# Patient Record
Sex: Female | Born: 1990 | Race: White | Hispanic: No | Smoking: Current every day smoker
Health system: Southern US, Community
[De-identification: ages and names within clinical notes are randomized; demographics above are authoritative.]

## PROBLEM LIST (undated history)

## (undated) DIAGNOSIS — G43909 Migraine, unspecified, not intractable, without status migrainosus: Secondary | ICD-10-CM

## (undated) DIAGNOSIS — F419 Anxiety disorder, unspecified: Secondary | ICD-10-CM

## (undated) DIAGNOSIS — F329 Major depressive disorder, single episode, unspecified: Secondary | ICD-10-CM

## (undated) DIAGNOSIS — N730 Acute parametritis and pelvic cellulitis: Secondary | ICD-10-CM

## (undated) DIAGNOSIS — F32A Depression, unspecified: Secondary | ICD-10-CM

## (undated) DIAGNOSIS — B009 Herpesviral infection, unspecified: Secondary | ICD-10-CM

---
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
## 2008-04-07 ENCOUNTER — Emergency Department: Payer: Self-pay | Admitting: Emergency Medicine

## 2008-08-25 ENCOUNTER — Emergency Department: Payer: Self-pay | Admitting: Emergency Medicine

## 2009-07-27 ENCOUNTER — Emergency Department: Payer: Self-pay | Admitting: Emergency Medicine

## 2010-01-18 ENCOUNTER — Emergency Department: Payer: Self-pay | Admitting: Emergency Medicine

## 2010-01-25 ENCOUNTER — Emergency Department: Payer: Self-pay | Admitting: Emergency Medicine

## 2010-03-27 ENCOUNTER — Emergency Department: Payer: Self-pay | Admitting: Unknown Physician Specialty

## 2011-05-01 ENCOUNTER — Emergency Department: Payer: Self-pay | Admitting: *Deleted

## 2011-10-12 IMAGING — US TRANSABDOMINAL ULTRASOUND OF PELVIS
1 series · 17 of 25 positions shown · non-contrast
Comparison: none

REASON FOR EXAM: pelvic pain for 2 months, ammenorrhea
COMMENTS:

[Series 1: transabdominal ultrasound of pelvis · 17 of 52 slices shown]
[im 1/52]
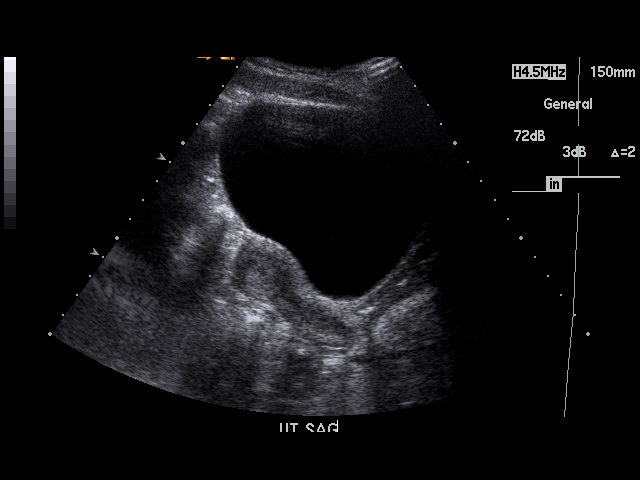
[im 5/52]
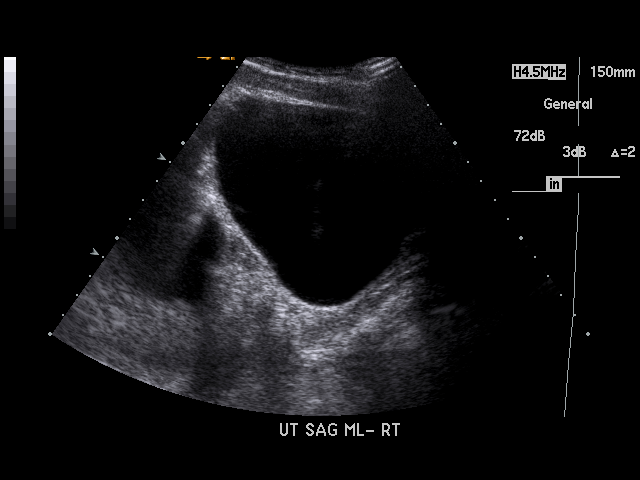
[im 7/52]
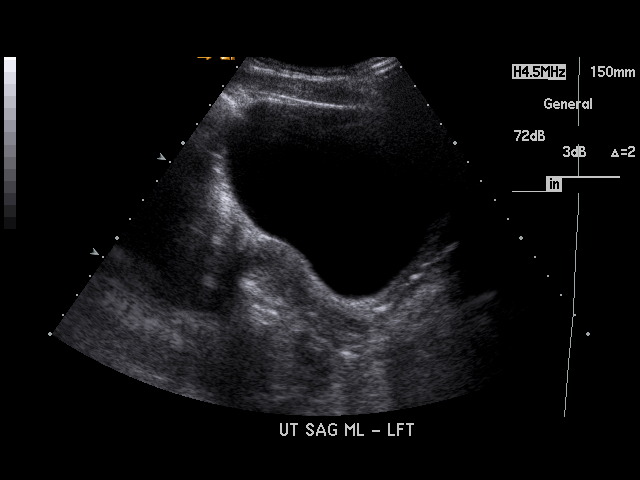
[im 11/52]
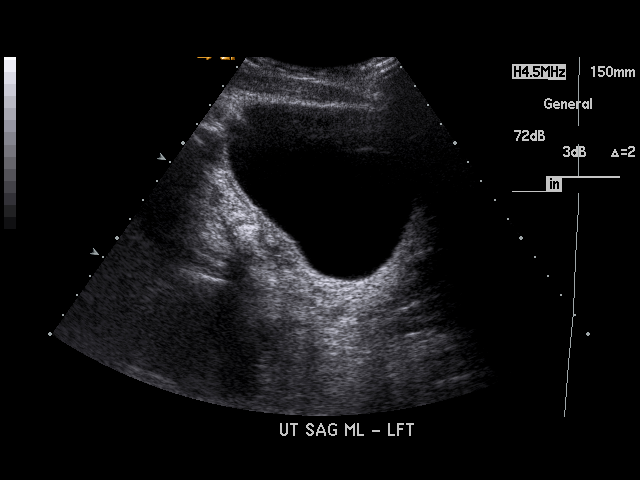
[im 13/52]
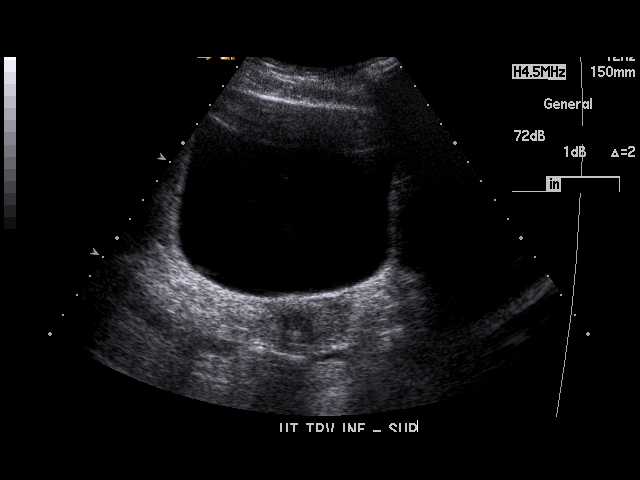
[im 18/52]
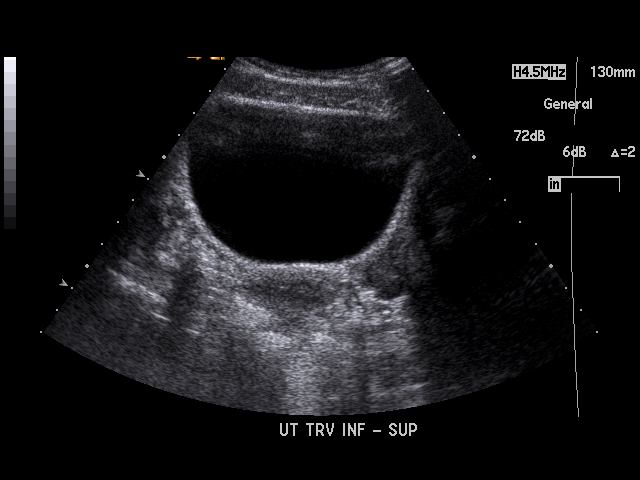
[im 20/52]
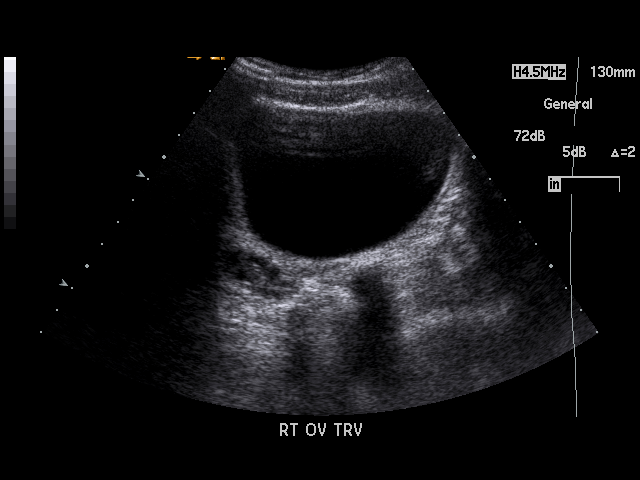
[im 24/52]
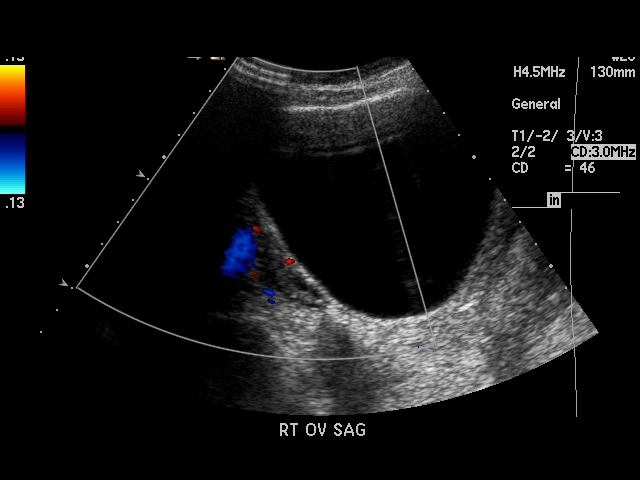
[im 26/52]
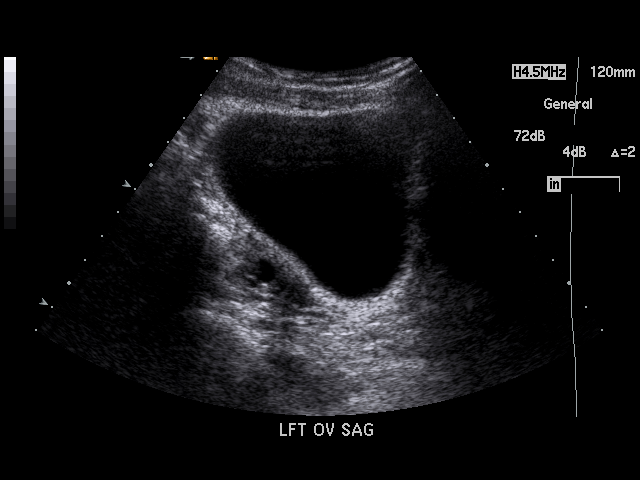
[im 28/52]
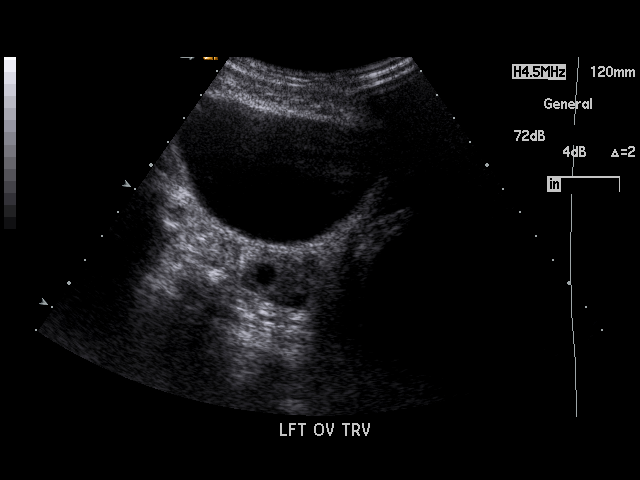
[im 32/52]
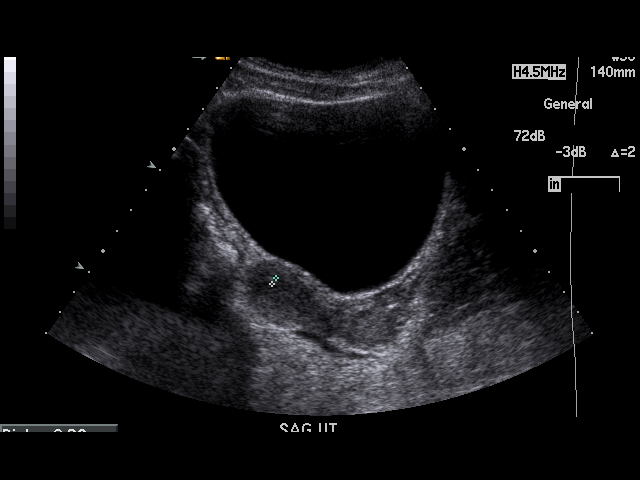
[im 35/52]
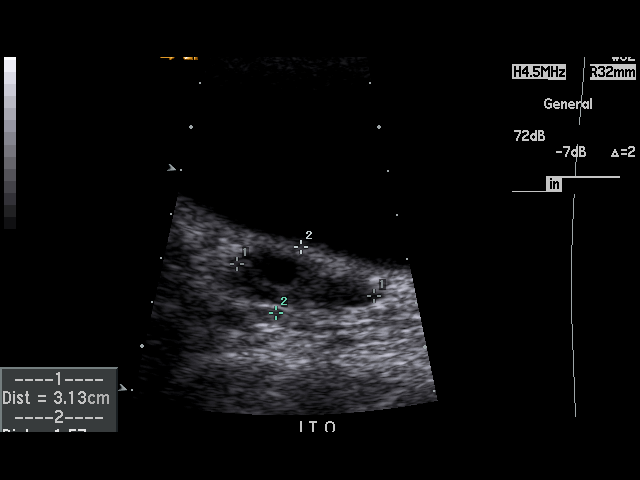
[im 39/52]
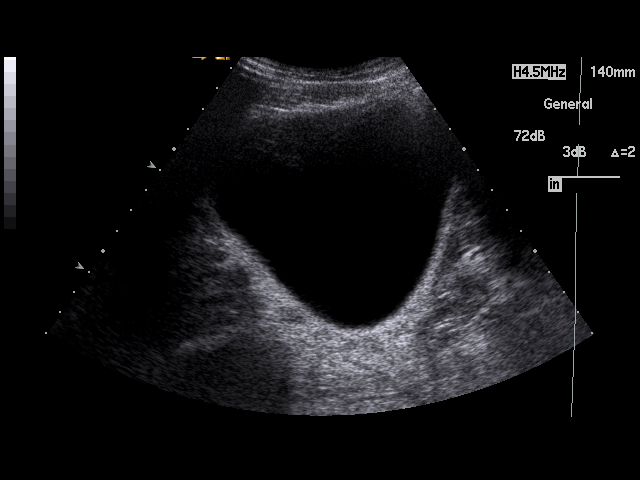
[im 41/52]
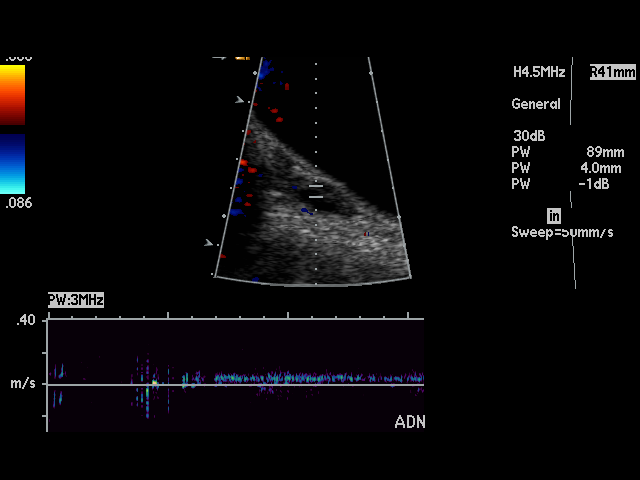
[im 45/52]
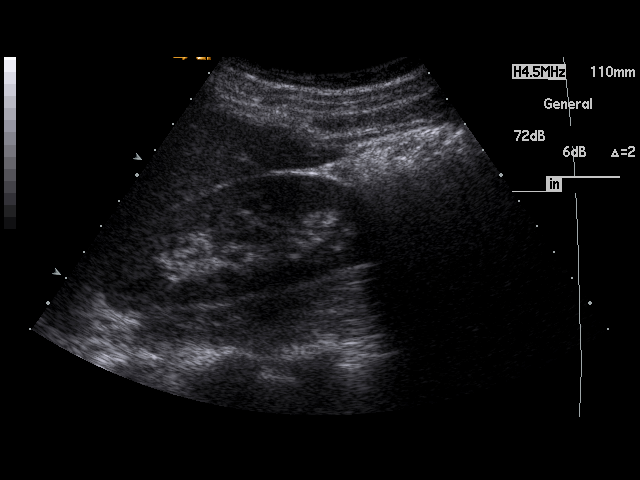
[im 47/52]
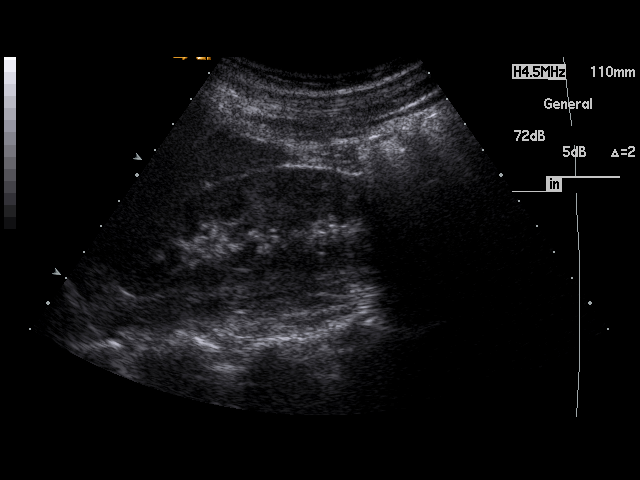
[im 52/52]
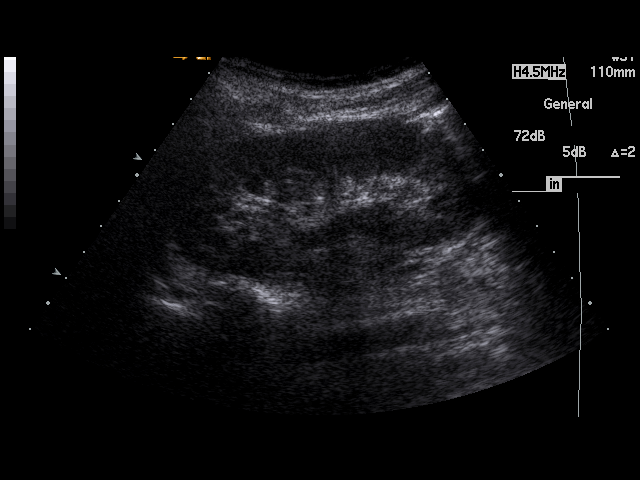

[17 of 25 positions shown; findings below may reference images not displayed]

PROCEDURE:     US  - US PELVIS EXAM  - January 18, 2010  [DATE]

RESULT:     Transabdominal Pelvic Ultrasound was performed. The uterus
measures 6.71 cm x 2.44 cm x 3.97 cm. The endometrium measures 2.9 mm in
thickness. No uterine mass lesions are seen. The ovaries are visualized
bilaterally. The right ovary measures 3.14 cm at maximum diameter and the
left ovary measures 3.13 cm at maximum diameter. Vascular flow is seen in
each ovary on Doppler examination. A few follicular cysts are seen in each
ovary. No abnormal adnexal masses are seen. There is a nonspecific trace of
free fluid in the cul-de-sac. The visualized portion of the urinary bladder
is normal in appearance. The kidneys show no hydronephrosis.
IMPRESSION: No significant abnormalities are identified.

## 2014-01-25 ENCOUNTER — Emergency Department: Payer: Self-pay | Admitting: Emergency Medicine

## 2014-01-25 LAB — CBC
HCT: 40.1 % (ref 35.0–47.0)
HGB: 12.9 g/dL (ref 12.0–16.0)
MCH: 28.9 pg (ref 26.0–34.0)
MCHC: 32.1 g/dL (ref 32.0–36.0)
MCV: 90 fL (ref 80–100)
PLATELETS: 189 10*3/uL (ref 150–440)
RBC: 4.45 10*6/uL (ref 3.80–5.20)
RDW: 16.2 % — ABNORMAL HIGH (ref 11.5–14.5)
WBC: 4 10*3/uL (ref 3.6–11.0)

## 2014-01-25 LAB — COMPREHENSIVE METABOLIC PANEL
ALBUMIN: 4.2 g/dL (ref 3.4–5.0)
ALT: 13 U/L — AB
Alkaline Phosphatase: 39 U/L — ABNORMAL LOW
Anion Gap: 5 — ABNORMAL LOW (ref 7–16)
BUN: 15 mg/dL (ref 7–18)
Bilirubin,Total: 0.2 mg/dL (ref 0.2–1.0)
Calcium, Total: 8.5 mg/dL (ref 8.5–10.1)
Chloride: 114 mmol/L — ABNORMAL HIGH (ref 98–107)
Co2: 21 mmol/L (ref 21–32)
Creatinine: 0.9 mg/dL (ref 0.60–1.30)
EGFR (African American): >60
Glucose: 73 mg/dL (ref 65–99)
Osmolality: 279 (ref 275–301)
Potassium: 3.3 mmol/L — ABNORMAL LOW (ref 3.5–5.1)
SGOT(AST): 24 U/L (ref 15–37)
SODIUM: 140 mmol/L (ref 136–145)
TOTAL PROTEIN: 7.6 g/dL (ref 6.4–8.2)

## 2014-01-25 LAB — CK TOTAL AND CKMB (NOT AT ARMC)
CK, Total: 94 U/L
CK-MB: 0.6 ng/mL (ref 0.5–3.6)

## 2014-01-25 LAB — URINALYSIS, COMPLETE
BACTERIA: NONE SEEN
BILIRUBIN, UR: NEGATIVE
GLUCOSE, UR: NEGATIVE mg/dL (ref 0–75)
Ketone: NEGATIVE
Leukocyte Esterase: NEGATIVE
NITRITE: NEGATIVE
PROTEIN: NEGATIVE
Ph: 5 (ref 4.5–8.0)
RBC,UR: 2 /HPF (ref 0–5)
Specific Gravity: 1.021 (ref 1.003–1.030)
Squamous Epithelial: <1

## 2014-01-25 LAB — TROPONIN I

## 2014-11-01 ENCOUNTER — Emergency Department
Admission: EM | Admit: 2014-11-01 | Discharge: 2014-11-01 | Disposition: A | Discharge status: Home / Self Care | Payer: Self-pay | Attending: Emergency Medicine | Admitting: Emergency Medicine

## 2014-11-01 ENCOUNTER — Encounter: Payer: Self-pay | Admitting: Emergency Medicine

## 2014-11-01 DIAGNOSIS — G43909 Migraine, unspecified, not intractable, without status migrainosus: Secondary | ICD-10-CM | POA: Insufficient documentation

## 2014-11-01 DIAGNOSIS — Z720 Tobacco use: Secondary | ICD-10-CM | POA: Insufficient documentation

## 2014-11-01 DIAGNOSIS — G43009 Migraine without aura, not intractable, without status migrainosus: Secondary | ICD-10-CM

## 2014-11-01 HISTORY — DX: Migraine, unspecified, not intractable, without status migrainosus: G43.909

## 2014-11-01 MED ORDER — DIPHENHYDRAMINE HCL 50 MG/ML IJ SOLN
25.0000 mg | Freq: Once | INTRAMUSCULAR | Status: AC
  Administered 2014-11-01: 25 mg via INTRAMUSCULAR

## 2014-11-01 MED ORDER — ONDANSETRON HCL 4 MG/2ML IJ SOLN
INTRAMUSCULAR | Status: AC
  Filled 2014-11-01: qty 2

## 2014-11-01 MED ORDER — PROCHLORPERAZINE EDISYLATE 5 MG/ML IJ SOLN
5.0000 mg | Freq: Once | INTRAMUSCULAR | Status: AC
  Administered 2014-11-01: 5 mg via INTRAMUSCULAR
  Filled 2014-11-01: qty 2

## 2014-11-01 MED ORDER — MORPHINE SULFATE 4 MG/ML IJ SOLN
INTRAMUSCULAR | Status: AC
  Filled 2014-11-01: qty 1

## 2014-11-01 MED ORDER — MORPHINE SULFATE 4 MG/ML IJ SOLN
4.0000 mg | Freq: Once | INTRAMUSCULAR | Status: DC
Start: 2014-11-01 — End: 2014-11-01

## 2014-11-01 MED ORDER — ONDANSETRON HCL 4 MG PO TABS: 4.0000 mg | ORAL_TABLET | Freq: Every day | ORAL | Status: AC | PRN

## 2014-11-01 MED ORDER — KETOROLAC TROMETHAMINE 30 MG/ML IJ SOLN
INTRAMUSCULAR | Status: AC
  Filled 2014-11-01: qty 1

## 2014-11-01 MED ORDER — ONDANSETRON HCL 4 MG/2ML IJ SOLN: 4.0000 mg | Freq: Once | INTRAMUSCULAR | Status: DC

## 2014-11-01 MED ORDER — BUTALBITAL-APAP-CAFFEINE 50-325-40 MG PO TABS: 1.0000 | ORAL_TABLET | ORAL | Status: AC | PRN

## 2014-11-01 MED ORDER — KETOROLAC TROMETHAMINE 30 MG/ML IJ SOLN
30.0000 mg | Freq: Once | INTRAMUSCULAR | Status: AC
  Administered 2014-11-01: 30 mg via INTRAVENOUS

## 2014-11-01 MED ORDER — DEXTROSE-NACL 5-0.45 % IV SOLN
1000.0000 mL | Freq: Once | INTRAVENOUS | Status: AC
  Administered 2014-11-01: 1000 mL via INTRAVENOUS

## 2014-11-01 MED ORDER — DIPHENHYDRAMINE HCL 50 MG/ML IJ SOLN
INTRAMUSCULAR | Status: AC
  Filled 2014-11-01: qty 1

## 2014-11-01 NOTE — ED Notes (Signed)
Called pharmacy to send compazine.

## 2014-11-01 NOTE — ED Notes (Signed)
Held morphine and zofran due to low bp.

## 2014-11-01 NOTE — ED Notes (Signed)
MD at bedside. 

## 2014-11-01 NOTE — ED Notes (Signed)
Called pharmacy to sen compazine.

## 2014-11-01 NOTE — ED Notes (Signed)
Pt presents to ER alert and in NAD. Pt reports hx of migraines twice a week. Pt states pain for 7 days.

## 2014-11-01 NOTE — Discharge Instructions (Signed)
1. TAKE MEDICINES AS NEEDED FOR HEADACHE & NAUSEA (FIORICET/ZOFRAN #20). 2. RETURN TO THE ER FOR WORSENING SYMPTOMS, PERSISTENT VOMITING, LETHARGY OR OTHER CONCERNS.  Recurrent Migraine Headache A migraine headache is very bad, throbbing pain on one or both sides of your head. Recurrent migraines keep coming back. Talk to your doctor about what things may bring on (trigger) your migraine headaches. HOME CARE  Only take medicines as told by your doctor.  Lie down in a dark, quiet room when you have a migraine.  Keep a journal to find out if certain things bring on migraine headaches. For example, write down:  What you eat and drink.  How much sleep you get.  Any change to your diet or medicines.  Lessen how much alcohol you drink.  Quit smoking if you smoke.  Get enough sleep.  Lessen any stress in your life.  Keep lights dim if bright lights bother you or make your migraines worse. GET HELP IF:  Medicine does not help your migraines.  Your pain keeps coming back.  You have a fever. GET HELP RIGHT AWAY IF:   Your migraine becomes really bad.  You have a stiff neck.  You have trouble seeing.  Your muscles are weak, or you lose muscle control.  You lose your balance or have trouble walking.  You feel like you will pass out (faint), or you pass out.  You have really bad symptoms that are different than your first symptoms. MAKE SURE YOU:   Understand these instructions.  Will watch your condition.  Will get help right away if you are not doing well or get worse. Document Released: 03/28/2008 Document Revised: 06/24/2013 Document Reviewed: 02/24/2013 [facility] Patient Information 2015 [employer], [place]. This information is not intended to replace advice given to you by your health care provider. Make sure you discuss any questions you have with your health care provider.

## 2014-11-01 NOTE — ED Provider Notes (Signed)
[facility] Emergency Department Provider Note    ____________________________________________  Time seen: 0130  I have reviewed the triage vital signs and the nursing notes.   HISTORY  Chief Complaint Migraine       HPI Alicia Mccarthy is a 24 y.o. female  who presents with migraine headache X 7 days. Patient reports migraines x years; has never seen a neurologist for same. Migraines are typically relieved with over-the-counter medications. Patient complains of frontal migraine upon awakening 7 days ago; partially relieved with over-the-counter medications. Patient complains of throbbing pain associated with photophobia, and occasional blurry vision.Patient denies nausea, vomiting, dizziness, neck pain.    Past Medical History  Diagnosis Date  . Migraines     There are no active problems to display for this patient.   No past surgical history on file.  Current Outpatient Rx  Name  Route  Sig  Dispense  Refill  . butalbital-acetaminophen-caffeine (FIORICET) 50-325-40 MG per tablet   Oral   Take 1 tablet by mouth every 4 (four) hours as needed for headache.   20 tablet   0   . ondansetron (ZOFRAN) 4 MG tablet   Oral   Take 1 tablet (4 mg total) by mouth daily as needed for nausea or vomiting.   20 tablet   1     Allergies Review of patient's allergies indicates no known allergies.  No family history on file.  Social History History  Substance Use Topics  . Smoking status: Current Every Day Smoker  . Smokeless tobacco: Not on file  . Alcohol Use: No    Review of Systems  Constitutional: Negative for fever. Eyes: Occasional blurry vision associated with migraine headache. ENT: Negative for sore throat. Negative for neck pain. Cardiovascular: Negative for chest pain. Respiratory: Negative for shortness of breath. Gastrointestinal: Negative for abdominal pain, vomiting and diarrhea. Genitourinary: Negative for  dysuria. Musculoskeletal: Negative for back pain. Skin: Negative for rash. Neurological: Positive for headaches, no focal weakness or numbness.   10-point ROS otherwise negative.  ____________________________________________   PHYSICAL EXAM:  VITAL SIGNS: ED Triage Vitals  Enc Vitals Group     BP 11/01/14 0040 99/62 mmHg     Pulse Rate 11/01/14 0040 78     Resp 11/01/14 0040 18     Temp 11/01/14 0040 97.8 F (36.6 C)     Temp Source 11/01/14 0040 Oral     SpO2 11/01/14 0040 100 %     Weight 11/01/14 0046 90 lb (40.824 kg)     Height 11/01/14 0046 5\' 3"  (1.6 m)     Head Cir --      Peak Flow --      Pain Score 11/01/14 0047 5     Pain Loc --      Pain Edu? --      Excl. in GC? --      Constitutional: Alert and oriented. Well appearing and in no distress. Eyes: Conjunctivae are normal. PERRL. Normal extraocular movements. Funduscopy within normal limits. No papilledema. ENT   Head: Normocephalic and atraumatic.   Nose: No congestion/rhinnorhea.   Mouth/Throat: Mucous membranes are moist.   Neck: No stridor. Supple neck. Hematological/Lymphatic/Immunilogical: No cervical lymphadenopathy. Cardiovascular: Normal rate, regular rhythm. Normal and symmetric distal pulses are present in all extremities. No murmurs, rubs, or gallops. Respiratory: Normal respiratory effort without tachypnea nor retractions. Breath sounds are clear and equal bilaterally. No wheezes/rales/rhonchi. Gastrointestinal: Soft and nontender. No distention. No abdominal bruits. There is no  CVA tenderness. Genitourinary: Deferred Musculoskeletal: Nontender with normal range of motion in all extremities. No joint effusions.   Right lower leg:  No tenderness or edema.   Left lower leg:  No tenderness or edema. Neurologic:  Normal speech and language. Cranial nerves II through XII intact. No gross focal neurologic deficits are appreciated. Speech is normal. No gait instability. Skin:  Skin  is warm, dry and intact. No rash noted. Psychiatric: Mood and affect are normal. Speech and behavior are normal. Patient exhibits appropriate insight and judgment.  ____________________________________________   EKG  None  ____________________________________________    RADIOLOGY  None  ____________________________________________   PROCEDURES  Procedure(s) performed: None  Critical Care performed: None  ____________________________________________   INITIAL IMPRESSION / ASSESSMENT AND PLAN / ED COURSE  Pertinent labs & imaging results that were available during my care of the patient were reviewed by me and considered in my medical decision making (see chart for details).  24 year old female presenting with migraine headache 7 days. Afebrile, supple neck, no focal neurologic abnormalities noted on exam. Will administer IM Compazine and Benadryl and reassess.  ----------------------------------------- 4:41 AM on 11/01/2014 -----------------------------------------  Patient states no relief of headache from IM medications. Appears to be resting comfortably in no acute distress. Will administer IV analgesia and reassess.  ----------------------------------------- 5:43 AM on 11/01/2014 -----------------------------------------  Patient improved. Neck remains supple. No focal neurologic abnormalities. Discussed with patient and family - plan for Fioricet, Zofran; follow-up neurology for further evaluation of headaches. Strict return precautions given. Patient and family verbalized understanding and agree.  ____________________________________________   FINAL CLINICAL IMPRESSION(S) / ED DIAGNOSES  Final diagnoses:  Nonintractable migraine, unspecified migraine type     Irean Hong, MD 11/01/14 [phone]

## 2016-03-02 ENCOUNTER — Emergency Department
Admission: EM | Admit: 2016-03-02 | Discharge: 2016-03-02 | Disposition: A | Discharge status: Home / Self Care | Payer: Self-pay | Attending: Emergency Medicine | Admitting: Emergency Medicine

## 2016-03-02 ENCOUNTER — Encounter: Payer: Self-pay | Admitting: Emergency Medicine

## 2016-03-02 DIAGNOSIS — F17200 Nicotine dependence, unspecified, uncomplicated: Secondary | ICD-10-CM | POA: Insufficient documentation

## 2016-03-02 DIAGNOSIS — G43909 Migraine, unspecified, not intractable, without status migrainosus: Secondary | ICD-10-CM | POA: Insufficient documentation

## 2016-03-02 DIAGNOSIS — G43009 Migraine without aura, not intractable, without status migrainosus: Secondary | ICD-10-CM

## 2016-03-02 MED ORDER — KETOROLAC TROMETHAMINE 30 MG/ML IJ SOLN
INTRAMUSCULAR | Status: AC
  Administered 2016-03-02: 30 mg via INTRAVENOUS
  Filled 2016-03-02: qty 1

## 2016-03-02 MED ORDER — KETOROLAC TROMETHAMINE 30 MG/ML IJ SOLN
30.0000 mg | Freq: Once | INTRAMUSCULAR | Status: AC
  Administered 2016-03-02: 30 mg via INTRAVENOUS

## 2016-03-02 MED ORDER — DIPHENHYDRAMINE HCL 50 MG/ML IJ SOLN
INTRAMUSCULAR | Status: AC
  Administered 2016-03-02: 25 mg via INTRAVENOUS
  Filled 2016-03-02: qty 1

## 2016-03-02 MED ORDER — METOCLOPRAMIDE HCL 5 MG/ML IJ SOLN
10.0000 mg | Freq: Once | INTRAMUSCULAR | Status: AC
  Administered 2016-03-02: 10 mg via INTRAVENOUS
  Filled 2016-03-02: qty 2

## 2016-03-02 MED ORDER — DIPHENHYDRAMINE HCL 50 MG/ML IJ SOLN
25.0000 mg | Freq: Once | INTRAMUSCULAR | Status: AC
  Administered 2016-03-02: 25 mg via INTRAVENOUS

## 2016-03-02 MED ORDER — SODIUM CHLORIDE 0.9 % IV BOLUS (SEPSIS)
1000.0000 mL | Freq: Once | INTRAVENOUS | Status: AC
  Administered 2016-03-02: 1000 mL via INTRAVENOUS

## 2016-03-02 MED ORDER — METOCLOPRAMIDE HCL 5 MG/ML IJ SOLN
INTRAMUSCULAR | Status: AC
  Administered 2016-03-02: 10 mg via INTRAVENOUS
  Filled 2016-03-02: qty 2

## 2016-03-02 NOTE — ED Provider Notes (Signed)
[facility] Provider Note  Time seen: 3:40 PM  I have reviewed the triage vital signs and the nursing notes.   HISTORY  Chief Complaint Headache    HPI Alicia Mccarthy is a 25 y.o. female with a past medical history migraines presents to the emergency department for migraine headache. According to the patient she has had a migraine for the past 3 days. States it feels typical of her normal migraines, with photo and phonophobia. States she has a migraine approximately once a week, but it is been approximately 8 or 9 months since she required going to the emergency department for a migraine. Patient states this migraine has been ongoing for approximately 3 days along with nausea, photo and phonophobia. Denies fever.  Past Medical History:  Diagnosis Date  . Migraines     There are no active problems to display for this patient.   History reviewed. No pertinent surgical history.  Prior to Admission medications   Not on File    No Known Allergies  History reviewed. No pertinent family history.  Social History Social History  Substance Use Topics  . Smoking status: Current Every Day Smoker  . Smokeless tobacco: Not on file  . Alcohol use No    Review of Systems Constitutional: Negative for fever. Eyes: Photophobia. Cardiovascular: Negative for chest pain. Respiratory: Negative for shortness of breath. Gastrointestinal: Negative for abdominal pain Musculoskeletal: Negative for back pain Neurological: Positive for headache. Denies focal weakness or numbness. 10-point ROS otherwise negative.  ____________________________________________   PHYSICAL EXAM:  VITAL SIGNS: ED Triage Vitals  Enc Vitals Group     BP 03/02/16 1405 109/71     Pulse Rate 03/02/16 1405 94     Resp 03/02/16 1405 16     Temp 03/02/16 1405 97.5 F (36.4 C)     Temp Source 03/02/16 1405 Oral     SpO2 03/02/16 1405 100 %     Weight 03/02/16 1404  105 lb (47.6 kg)     Height 03/02/16 1404 5\' 2"  (1.575 m)     Head Circumference --      Peak Flow --      Pain Score 03/02/16 1404 5     Pain Loc --      Pain Edu? --      Excl. in GC? --     Constitutional: Alert and oriented. Well appearing and in no distress. Eyes: Normal exam ENT   Head: Normocephalic and atraumatic.   Mouth/Throat: Mucous membranes are moist. Cardiovascular: Normal rate, regular rhythm. No murmur Respiratory: Normal respiratory effort without tachypnea nor retractions. Breath sounds are clear Gastrointestinal: Soft and nontender. No distention. Musculoskeletal: Nontender with normal range of motion in all extremities. Neurologic:  Normal speech and language. No gross focal neurologic deficits are appreciated. Equal grip strengths. No pronator drift. Sensation is equal. Skin:  Skin is warm, dry and intact.  Psychiatric: Mood and affect are normal. Speech and behavior are normal.   ____________________________________________    INITIAL IMPRESSION / ASSESSMENT AND PLAN / ED COURSE  Pertinent labs & imaging results that were available during my care of the patient were reviewed by me and considered in my medical decision making (see chart for details).  Patient presents emergency department with symptoms consistent with a typical migraine. We will dose medications, and closer monitoring in the emergency department for relief. Patient has a normal physical exam including neurological examination at this time.  Patient states her headache is  down to a 1/10. We will discharge home with PCP follow-up. Patient agreeable plan. Discussed normal headache return precautions.  ____________________________________________   FINAL CLINICAL IMPRESSION(S) / ED DIAGNOSES  Migraine headache    Minna Antis, MD 03/02/16 [phone]

## 2016-03-02 NOTE — ED Triage Notes (Signed)
Pt has hx migraines. Thought she had sinus headache but then pain in neck and had some vomiting today. Pt looks well in triage. Tried OTC medication but has not helped. Does not have prescription medicine for her migraines. Minimal photophobia.

## 2017-02-01 ENCOUNTER — Ambulatory Visit: Payer: Self-pay | Admitting: Family Medicine

## 2017-06-11 ENCOUNTER — Emergency Department
Admission: EM | Admit: 2017-06-11 | Discharge: 2017-06-11 | Disposition: A | Discharge status: Home / Self Care | Payer: Self-pay | Attending: Emergency Medicine | Admitting: Emergency Medicine

## 2017-06-11 ENCOUNTER — Other Ambulatory Visit: Payer: Self-pay

## 2017-06-11 ENCOUNTER — Emergency Department: Payer: Self-pay

## 2017-06-11 ENCOUNTER — Encounter: Payer: Self-pay | Admitting: Emergency Medicine

## 2017-06-11 DIAGNOSIS — Y999 Unspecified external cause status: Secondary | ICD-10-CM | POA: Insufficient documentation

## 2017-06-11 DIAGNOSIS — S161XXA Strain of muscle, fascia and tendon at neck level, initial encounter: Secondary | ICD-10-CM | POA: Insufficient documentation

## 2017-06-11 DIAGNOSIS — Y92410 Unspecified street and highway as the place of occurrence of the external cause: Secondary | ICD-10-CM | POA: Insufficient documentation

## 2017-06-11 DIAGNOSIS — F17200 Nicotine dependence, unspecified, uncomplicated: Secondary | ICD-10-CM | POA: Insufficient documentation

## 2017-06-11 DIAGNOSIS — J329 Chronic sinusitis, unspecified: Secondary | ICD-10-CM | POA: Insufficient documentation

## 2017-06-11 DIAGNOSIS — Y9389 Activity, other specified: Secondary | ICD-10-CM | POA: Insufficient documentation

## 2017-06-11 DIAGNOSIS — B9689 Other specified bacterial agents as the cause of diseases classified elsewhere: Secondary | ICD-10-CM | POA: Insufficient documentation

## 2017-06-11 MED ORDER — MORPHINE SULFATE (PF) 2 MG/ML IV SOLN
2.0000 mg | Freq: Once | INTRAVENOUS | Status: AC
  Administered 2017-06-11: 2 mg via INTRAMUSCULAR
  Filled 2017-06-11: qty 1

## 2017-06-11 MED ORDER — MORPHINE SULFATE (PF) 2 MG/ML IV SOLN: 2.0000 mg | Freq: Once | INTRAVENOUS | Status: DC

## 2017-06-11 MED ORDER — MAGIC MOUTHWASH W/LIDOCAINE: 5.0000 mL | Freq: Four times a day (QID) | ORAL | 0 refills | Status: DC

## 2017-06-11 MED ORDER — ONDANSETRON 8 MG PO TBDP
8.0000 mg | ORAL_TABLET | Freq: Once | ORAL | Status: AC
  Administered 2017-06-11: 8 mg via ORAL
  Filled 2017-06-11: qty 1

## 2017-06-11 MED ORDER — MELOXICAM 15 MG PO TABS
15.0000 mg | ORAL_TABLET | Freq: Every day | ORAL | 0 refills | Status: DC
Start: 2017-06-11 — End: 2022-02-19

## 2017-06-11 MED ORDER — AMOXICILLIN-POT CLAVULANATE 250-62.5 MG/5ML PO SUSR: 750.0000 mg | Freq: Two times a day (BID) | ORAL | 0 refills | Status: AC

## 2017-06-11 MED ORDER — FLUTICASONE PROPIONATE 50 MCG/ACT NA SUSP: 1.0000 | Freq: Two times a day (BID) | NASAL | 0 refills | Status: DC

## 2017-06-11 MED ORDER — ORPHENADRINE CITRATE 30 MG/ML IJ SOLN
60.0000 mg | Freq: Once | INTRAMUSCULAR | Status: AC
  Administered 2017-06-11: 60 mg via INTRAMUSCULAR
  Filled 2017-06-11: qty 2

## 2017-06-11 MED ORDER — KETOROLAC TROMETHAMINE 30 MG/ML IJ SOLN
30.0000 mg | Freq: Once | INTRAMUSCULAR | Status: AC
  Administered 2017-06-11: 30 mg via INTRAMUSCULAR
  Filled 2017-06-11: qty 1

## 2017-06-11 MED ORDER — METHOCARBAMOL 500 MG PO TABS: 500.0000 mg | ORAL_TABLET | Freq: Four times a day (QID) | ORAL | 0 refills | Status: DC
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
## 2017-06-26 ENCOUNTER — Emergency Department: Payer: Self-pay

## 2017-06-26 ENCOUNTER — Encounter: Payer: Self-pay | Admitting: Emergency Medicine

## 2017-06-26 ENCOUNTER — Emergency Department
Admission: EM | Admit: 2017-06-26 | Discharge: 2017-06-26 | Disposition: A | Discharge status: Home / Self Care | Payer: Self-pay | Attending: Emergency Medicine | Admitting: Emergency Medicine

## 2017-06-26 ENCOUNTER — Other Ambulatory Visit: Payer: Self-pay

## 2017-06-26 DIAGNOSIS — O99331 Smoking (tobacco) complicating pregnancy, first trimester: Secondary | ICD-10-CM | POA: Insufficient documentation

## 2017-06-26 DIAGNOSIS — R1031 Right lower quadrant pain: Secondary | ICD-10-CM | POA: Insufficient documentation

## 2017-06-26 DIAGNOSIS — Z3491 Encounter for supervision of normal pregnancy, unspecified, first trimester: Secondary | ICD-10-CM

## 2017-06-26 DIAGNOSIS — Z79899 Other long term (current) drug therapy: Secondary | ICD-10-CM | POA: Insufficient documentation

## 2017-06-26 DIAGNOSIS — O26891 Other specified pregnancy related conditions, first trimester: Secondary | ICD-10-CM | POA: Insufficient documentation

## 2017-06-26 DIAGNOSIS — Z3A01 Less than 8 weeks gestation of pregnancy: Secondary | ICD-10-CM | POA: Insufficient documentation

## 2017-06-26 DIAGNOSIS — F17200 Nicotine dependence, unspecified, uncomplicated: Secondary | ICD-10-CM | POA: Insufficient documentation

## 2017-06-26 HISTORY — DX: Herpesviral infection, unspecified: B00.9

## 2017-06-26 HISTORY — DX: Acute parametritis and pelvic cellulitis: N73.0

## 2017-06-26 LAB — CBC
HCT: 41.4 % (ref 35.0–47.0)
Hemoglobin: 13.8 g/dL (ref 12.0–16.0)
MCH: 31.9 pg (ref 26.0–34.0)
MCHC: 33.3 g/dL (ref 32.0–36.0)
MCV: 95.7 fL (ref 80.0–100.0)
Platelets: 232 K/uL (ref 150–440)
RBC: 4.32 MIL/uL (ref 3.80–5.20)
RDW: 16.1 % — ABNORMAL HIGH (ref 11.5–14.5)
WBC: 5.8 K/uL (ref 3.6–11.0)

## 2017-06-26 LAB — COMPREHENSIVE METABOLIC PANEL WITH GFR
ALT: 11 U/L — ABNORMAL LOW (ref 14–54)
AST: 19 U/L (ref 15–41)
Albumin: 4.3 g/dL (ref 3.5–5.0)
Alkaline Phosphatase: 55 U/L (ref 38–126)
Anion gap: 9 (ref 5–15)
BUN: 11 mg/dL (ref 6–20)
CO2: 24 mmol/L (ref 22–32)
Calcium: 8.7 mg/dL — ABNORMAL LOW (ref 8.9–10.3)
Chloride: 102 mmol/L (ref 101–111)
Creatinine, Ser: 0.66 mg/dL (ref 0.44–1.00)
GFR calc Af Amer: >60 mL/min (ref >60)
GFR calc non Af Amer: >60 mL/min (ref >60)
Glucose, Bld: 91 mg/dL (ref 65–99)
Potassium: 3.5 mmol/L (ref 3.5–5.1)
Sodium: 135 mmol/L (ref 135–145)
Total Bilirubin: 0.6 mg/dL (ref 0.3–1.2)
Total Protein: 7 g/dL (ref 6.5–8.1)

## 2017-06-26 LAB — URINALYSIS, COMPLETE (UACMP) WITH MICROSCOPIC
Bilirubin Urine: NEGATIVE
Glucose, UA: NEGATIVE mg/dL
Ketones, ur: NEGATIVE mg/dL
Leukocytes, UA: NEGATIVE
Nitrite: NEGATIVE
Protein, ur: NEGATIVE mg/dL
Specific Gravity, Urine: 1.005 (ref 1.005–1.030)
pH: 7 (ref 5.0–8.0)

## 2017-06-26 LAB — POCT PREGNANCY, URINE
Preg Test, Ur: POSITIVE — AB
Preg Test, Ur: POSITIVE — AB

## 2017-06-26 LAB — HCG, QUANTITATIVE, PREGNANCY: HCG, BETA CHAIN, QUANT, S: 3349 m[IU]/mL — AB (ref <5)

## 2017-06-26 NOTE — ED Notes (Signed)

## 2017-06-26 NOTE — ED Triage Notes (Signed)
Says left lower quad pain worsening since yesterday.  Crampy.  Also says she is pregnant--by home preg test.

## 2017-06-26 NOTE — ED Notes (Signed)
MD Kinner at bedside  

## 2017-06-26 NOTE — ED Notes (Signed)
Patient transported to Ultrasound 

## 2017-06-27 NOTE — ED Provider Notes (Signed)
[facility] Emergency Department Provider Note   ____________________________________________    I have reviewed the triage vital signs and the nursing notes.   HISTORY  Chief Complaint Abdominal Pain     HPI Alicia Mccarthy is a 26 y.o. female who presents with mild intermittent cramping in her bilateral lower abdomen.  Patient reports she thinks that she is approximately 6-[redacted] weeks pregnant.  She reports she and her significant other have been trying to conceive for over 5 years and so she is quite excited about this pregnancy.  She is concerned that her cramping may signify a miscarriage.  She denies vaginal discharge, no vaginal bleeding.  No dysuria.  No fevers chills nausea or vomiting.  Past Medical History:  Diagnosis Date  . Herpes simplex   . Migraines   . PID (acute pelvic inflammatory disease)     There are no active problems to display for this patient.   History reviewed. No pertinent surgical history.  Prior to Admission medications   Medication Sig Start Date End Date Taking? Authorizing Provider  fluticasone (FLONASE) 50 MCG/ACT nasal spray Place 1 spray into both nostrils 2 (two) times daily. 06/11/17   Cuthriell, Delorise RoyalsJonathan D, PA-C  magic mouthwash w/lidocaine SOLN Take 5 mLs by mouth 4 (four) times daily. 06/11/17   Cuthriell, Delorise RoyalsJonathan D, PA-C  meloxicam (MOBIC) 15 MG tablet Take 1 tablet (15 mg total) by mouth daily. 06/11/17   Cuthriell, Delorise RoyalsJonathan D, PA-C  methocarbamol (ROBAXIN) 500 MG tablet Take 1 tablet (500 mg total) by mouth 4 (four) times daily. 06/11/17   Cuthriell, Delorise RoyalsJonathan D, PA-C     Allergies Tramadol and Vicodin [hydrocodone-acetaminophen]  No family history on file.  Social History Social History   Tobacco Use  . Smoking status: Current Every Day Smoker  . Smokeless tobacco: Never Used  Substance Use Topics  . Alcohol use: No  . Drug use: Not on file    Review of Systems  Constitutional: No  fever/chills Eyes: No visual changes.  ENT: No sore throat. Cardiovascular: Denies chest pain. Respiratory: Denies shortness of breath. Gastrointestinal: As above Genitourinary: As above Musculoskeletal: Negative for back pain. Skin: Negative for rash. Neurological: Negative for headaches   ____________________________________________   PHYSICAL EXAM:  VITAL SIGNS: ED Triage Vitals  Enc Vitals Group     BP 06/26/17 1422 128/67     Pulse Rate 06/26/17 1422 72     Resp 06/26/17 1422 14     Temp 06/26/17 1422 98 F (36.7 C)     Temp Source 06/26/17 1422 Oral     SpO2 06/26/17 1422 99 %     Weight 06/26/17 1422 42.6 kg (94 lb)     Height 06/26/17 1422 1.575 m (5\' 2" )     Head Circumference --      Peak Flow --      Pain Score 06/26/17 1421 0     Pain Loc --      Pain Edu? --      Excl. in GC? --     Constitutional: Alert and oriented. No acute distress. Pleasant and interactive Eyes: Conjunctivae are normal.   Nose: No congestion/rhinnorhea. Mouth/Throat: Mucous membranes are moist.    Cardiovascular: Normal rate, regular rhythm. Grossly normal heart sounds.  Good peripheral circulation. Respiratory: Normal respiratory effort.  No retractions. Lungs CTAB. Gastrointestinal: Soft and nontender. No distention.  No CVA tenderness.  Musculoskeletal:  Warm and well perfused Neurologic:  Normal speech and language. No  gross focal neurologic deficits are appreciated.  Skin:  Skin is warm, dry and intact. No rash noted. Psychiatric: Mood and affect are normal. Speech and behavior are normal.  ____________________________________________   LABS (all labs ordered are listed, but only abnormal results are displayed)  Labs Reviewed  COMPREHENSIVE METABOLIC PANEL - Abnormal; Notable for the following components:      Result Value   Calcium 8.7 (*)    ALT 11 (*)    All other components within normal limits  CBC - Abnormal; Notable for the following components:   RDW 16.1  (*)    All other components within normal limits  URINALYSIS, COMPLETE (UACMP) WITH MICROSCOPIC - Abnormal; Notable for the following components:   Color, Urine STRAW (*)    APPearance HAZY (*)    Hgb urine dipstick SMALL (*)    Bacteria, UA MANY (*)    Squamous Epithelial / LPF 6-30 (*)    All other components within normal limits  HCG, QUANTITATIVE, PREGNANCY - Abnormal; Notable for the following components:   hCG, Beta Chain, Quant, S 3,349 (*)    All other components within normal limits  POCT PREGNANCY, URINE - Abnormal; Notable for the following components:   Preg Test, Ur POSITIVE (*)    All other components within normal limits  POCT PREGNANCY, URINE - Abnormal; Notable for the following components:   Preg Test, Ur POSITIVE (*)    All other components within normal limits   ____________________________________________  EKG  None ____________________________________________  RADIOLOGY  Ultrasound shows 5-week 2-day intrauterine gestational sac ____________________________________________   PROCEDURES  Procedure(s) performed: No  Procedures   Critical Care performed: No ____________________________________________   INITIAL IMPRESSION / ASSESSMENT AND PLAN / ED COURSE  Pertinent labs & imaging results that were available during my care of the patient were reviewed by me and considered in my medical decision making (see chart for details).  Patient presents with mild intermittent cramping as described above.  Last period was in early November.  Beta-hCG is over 3000.  No vaginal bleeding  Ultrasound demonstrates 5-week 2-day gestational sac.  Discussed this with the patient and noted that at this early stage we frequently are unable to see fetal pole and that she will need repeat ultrasound and follow-up closely with OB    ____________________________________________   FINAL CLINICAL IMPRESSION(S) / ED DIAGNOSES  Final diagnoses:  First trimester  pregnancy        Note:  This document was prepared using Dragon voice recognition software and may include unintentional dictation errors.    Jene EveryKinner, Derica Leiber, MD 06/27/17 [phone]

## 2017-08-09 ENCOUNTER — Encounter: Payer: Self-pay | Admitting: Emergency Medicine

## 2017-08-09 ENCOUNTER — Emergency Department
Admission: EM | Admit: 2017-08-09 | Discharge: 2017-08-09 | Disposition: A | Discharge status: Home / Self Care | Payer: Medicaid Other | Attending: Emergency Medicine | Admitting: Emergency Medicine

## 2017-08-09 ENCOUNTER — Emergency Department: Payer: Medicaid Other

## 2017-08-09 ENCOUNTER — Other Ambulatory Visit: Payer: Self-pay

## 2017-08-09 DIAGNOSIS — R109 Unspecified abdominal pain: Secondary | ICD-10-CM | POA: Insufficient documentation

## 2017-08-09 DIAGNOSIS — Z3A12 12 weeks gestation of pregnancy: Secondary | ICD-10-CM | POA: Diagnosis not present

## 2017-08-09 DIAGNOSIS — M542 Cervicalgia: Secondary | ICD-10-CM | POA: Diagnosis not present

## 2017-08-09 DIAGNOSIS — O468X1 Other antepartum hemorrhage, first trimester: Secondary | ICD-10-CM | POA: Insufficient documentation

## 2017-08-09 DIAGNOSIS — O99331 Smoking (tobacco) complicating pregnancy, first trimester: Secondary | ICD-10-CM | POA: Diagnosis not present

## 2017-08-09 DIAGNOSIS — O418X10 Other specified disorders of amniotic fluid and membranes, first trimester, not applicable or unspecified: Secondary | ICD-10-CM | POA: Diagnosis not present

## 2017-08-09 DIAGNOSIS — O9989 Other specified diseases and conditions complicating pregnancy, childbirth and the puerperium: Secondary | ICD-10-CM | POA: Insufficient documentation

## 2017-08-09 DIAGNOSIS — F17210 Nicotine dependence, cigarettes, uncomplicated: Secondary | ICD-10-CM | POA: Diagnosis not present

## 2017-08-09 DIAGNOSIS — Z79899 Other long term (current) drug therapy: Secondary | ICD-10-CM | POA: Insufficient documentation

## 2017-08-09 DIAGNOSIS — Z3A11 11 weeks gestation of pregnancy: Secondary | ICD-10-CM | POA: Insufficient documentation

## 2017-08-09 DIAGNOSIS — Z3401 Encounter for supervision of normal first pregnancy, first trimester: Secondary | ICD-10-CM | POA: Diagnosis not present

## 2017-08-09 HISTORY — DX: Depression, unspecified: F32.A

## 2017-08-09 HISTORY — DX: Major depressive disorder, single episode, unspecified: F32.9

## 2017-08-09 HISTORY — DX: Anxiety disorder, unspecified: F41.9

## 2017-08-09 LAB — POCT PREGNANCY, URINE: Preg Test, Ur: POSITIVE — AB

## 2017-08-09 LAB — HCG, QUANTITATIVE, PREGNANCY: hCG, Beta Chain, Quant, S: 95167 m[IU]/mL — ABNORMAL HIGH
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
## 2017-08-09 NOTE — ED Notes (Signed)
Pt up to STAT desk, states that she needs to leave for approx 30 minutes to go somewhere. Encouraged patient to stay to be evaluated by EDP. States that she must leave but will return. Pt alert and oriented X4, active, cooperative, pt in NAD. RR even and unlabored, color WNL.
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
## 2018-09-08 ENCOUNTER — Emergency Department
Admission: EM | Admit: 2018-09-08 | Discharge: 2018-09-08 | Disposition: A | Discharge status: Home / Self Care | Payer: Self-pay | Attending: Emergency Medicine | Admitting: Emergency Medicine

## 2018-09-08 ENCOUNTER — Encounter: Payer: Self-pay | Admitting: Emergency Medicine

## 2018-09-08 ENCOUNTER — Other Ambulatory Visit: Payer: Self-pay

## 2018-09-08 DIAGNOSIS — F17210 Nicotine dependence, cigarettes, uncomplicated: Secondary | ICD-10-CM | POA: Insufficient documentation

## 2018-09-08 DIAGNOSIS — H6593 Unspecified nonsuppurative otitis media, bilateral: Secondary | ICD-10-CM | POA: Insufficient documentation

## 2018-09-08 DIAGNOSIS — Z79899 Other long term (current) drug therapy: Secondary | ICD-10-CM | POA: Insufficient documentation

## 2018-09-08 MED ORDER — PREDNISONE 10 MG (21) PO TBPK: ORAL_TABLET | ORAL | 0 refills | Status: DC

## 2018-09-08 NOTE — ED Notes (Signed)
Pt states she continues to have ringing in ears and is having new sxs of "chest rattling", low back pain, non-productive dry cough x 4 days. Pt states hx of chronic sinus sxs x 2 years. Pt states she ran a temperature of 101 on this past Friday.

## 2018-09-08 NOTE — ED Triage Notes (Signed)
Pt to ED via POV, pt states that she currently has a sinus infection. Pt states that she is having ringing in her ears and headaches that turn into migraines about once a week. Pt currently in NAD.

## 2018-09-08 NOTE — ED Provider Notes (Signed)
[facility] Emergency Department Provider Note  ____________________________________________  Time seen: Approximately 5:43 PM  I have reviewed the triage vital signs and the nursing notes.   HISTORY  Chief Complaint Tinnitus and Recurrent Sinusitis    HPI Alicia Mccarthy is a 28 y.o. female presents to the emergency department with muffled hearing and bilateral tinnitus.  Patient reports that she has had worsening symptoms for the past month but feels like she has experienced tinnitus and muffled hearing for approximately 2 years.  Patient is a daily smoker and smokes approximately a pack a day.  No discharge from the bilateral ears.  No ear trauma.  Patient does not take any medications chronically.  No fever or chills.  Patient denies otalgia.  No other alleviating measures have been attempted.        Past Medical History:  Diagnosis Date  . Anxiety   . Depression   . Herpes simplex   . Migraines   . PID (acute pelvic inflammatory disease)     There are no active problems to display for this patient.   Past Surgical History:  Procedure Laterality Date  . CESAREAN SECTION      Prior to Admission medications   Medication Sig Start Date End Date Taking? Authorizing Provider  fluticasone (FLONASE) 50 MCG/ACT nasal spray Place 1 spray into both nostrils 2 (two) times daily. 06/11/17   Cuthriell, Delorise Royals, PA-C  magic mouthwash w/lidocaine SOLN Take 5 mLs by mouth 4 (four) times daily. 06/11/17   Cuthriell, Delorise Royals, PA-C  meloxicam (MOBIC) 15 MG tablet Take 1 tablet (15 mg total) by mouth daily. 06/11/17   Cuthriell, Delorise Royals, PA-C  methocarbamol (ROBAXIN) 500 MG tablet Take 1 tablet (500 mg total) by mouth 4 (four) times daily. 06/11/17   Cuthriell, Delorise Royals, PA-C  predniSONE (STERAPRED UNI-PAK 21 TAB) 10 MG (21) TBPK tablet Take 6 tablets the first day, take 5 tablets the second day, take 4 tablets the third day, take 3 tablets the fourth  day, take 2 tablets the fifth day, take 1 tablet the sixth day. 09/08/18   Orvil Feil, PA-C    Allergies Tramadol and Vicodin [hydrocodone-acetaminophen]  No family history on file.  Social History Social History   Tobacco Use  . Smoking status: Current Every Day Smoker    Packs/day: 0.50    Types: Cigarettes  . Smokeless tobacco: Never Used  Substance Use Topics  . Alcohol use: No  . Drug use: No     Review of Systems  Constitutional: No fever/chills Eyes: No visual changes. No discharge ENT: Patient has bilateral tinnitus.  Cardiovascular: no chest pain. Respiratory: no cough. No SOB. Gastrointestinal: No abdominal pain.  No nausea, no vomiting.  No diarrhea.  No constipation. Genitourinary: Negative for dysuria. No hematuria Musculoskeletal: Negative for musculoskeletal pain. Skin: Negative for rash, abrasions, lacerations, ecchymosis. Neurological: Negative for headaches, focal weakness or numbness.   ____________________________________________   PHYSICAL EXAM:  VITAL SIGNS: ED Triage Vitals  Enc Vitals Group     BP 09/08/18 1617 136/78     Pulse --      Resp 09/08/18 1617 16     Temp 09/08/18 1617 98.6 F (37 C)     Temp Source 09/08/18 1617 Oral     SpO2 09/08/18 1617 99 %     Weight 09/08/18 1618 105 lb (47.6 kg)     Height 09/08/18 1618 5\' 2"  (1.575 m)     Head Circumference --  Peak Flow --      Pain Score 09/08/18 1618 0     Pain Loc --      Pain Edu? --      Excl. in GC? --      Constitutional: Alert and oriented. Well appearing and in no acute distress. Eyes: Conjunctivae are normal. PERRL. EOMI. Head: Atraumatic. ENT:      Ears: TMs are effused bilaterally without erythema or evidence of purulent exudate.      Nose: No congestion/rhinnorhea.      Mouth/Throat: Mucous membranes are moist.  Neck: No stridor.  No cervical spine tenderness to palpation. Hematological/Lymphatic/Immunilogical: No cervical lymphadenopathy.   Cardiovascular: Normal rate, regular rhythm. Normal S1 and S2.  Good peripheral circulation. Respiratory: Normal respiratory effort without tachypnea or retractions. Lungs CTAB. Good air entry to the bases with no decreased or absent breath sounds. Gastrointestinal: Bowel sounds 4 quadrants. Soft and nontender to palpation. No guarding or rigidity. No palpable masses. No distention. No CVA tenderness. Musculoskeletal: Full range of motion to all extremities. No gross deformities appreciated. Neurologic:  Normal speech and language. No gross focal neurologic deficits are appreciated.  Skin:  Skin is warm, dry and intact. No rash noted. Psychiatric: Mood and affect are normal. Speech and behavior are normal. Patient exhibits appropriate insight and judgement.   ____________________________________________   LABS (all labs ordered are listed, but only abnormal results are displayed)  Labs Reviewed - No data to display ____________________________________________  EKG   ____________________________________________  RADIOLOGY   No results found.  ____________________________________________    PROCEDURES  Procedure(s) performed:    Procedures    Medications - No data to display   ____________________________________________   INITIAL IMPRESSION / ASSESSMENT AND PLAN / ED COURSE  Pertinent labs & imaging results that were available during my care of the patient were reviewed by me and considered in my medical decision making (see chart for details).  Review of the  CSRS was performed in accordance of the N[facility] prior to dispensing any controlled drugs.           Assessment and plan Tinnitus Patient presents to the emergency department with bilateral tinnitus that is a worsened in intensity over the past month.  On physical exam, patient had bilateral middle ear effusions.  Patient likely has eustachian tube dysfunction secondary to viral URI and daily smoking.   I advocated for smoking cessation during this emergency department encounter.  Patient has been taking Flonase daily so patient was also treated with prednisone.  Advised dual therapy over the next 10 days.  Patient was advised to follow-up with primary care as needed.  All patient questions were answered.    ____________________________________________  FINAL CLINICAL IMPRESSION(S) / ED DIAGNOSES  Final diagnoses:  Fluid level behind tympanic membrane of both ears      NEW MEDICATIONS STARTED DURING THIS VISIT:  ED Discharge Orders         Ordered    predniSONE (STERAPRED UNI-PAK 21 TAB) 10 MG (21) TBPK tablet     09/08/18 1738              This chart was dictated using voice recognition software/Dragon. Despite best efforts to proofread, errors can occur which can change the meaning. Any change was purely unintentional.    Orvil Feil, PA-C 09/08/18 1747    Sharman Cheek, MD 09/10/18 [phone]

## 2018-12-20 ENCOUNTER — Encounter: Payer: Self-pay | Admitting: Emergency Medicine

## 2018-12-20 ENCOUNTER — Other Ambulatory Visit: Payer: Self-pay

## 2018-12-20 DIAGNOSIS — R51 Headache: Secondary | ICD-10-CM | POA: Insufficient documentation

## 2018-12-20 DIAGNOSIS — Z79899 Other long term (current) drug therapy: Secondary | ICD-10-CM | POA: Insufficient documentation

## 2018-12-20 LAB — CBC WITH DIFFERENTIAL/PLATELET
Abs Immature Granulocytes: 0.01 10*3/uL (ref 0.00–0.07)
Basophils Absolute: 0.1 10*3/uL (ref 0.0–0.1)
Basophils Relative: 2 %
Eosinophils Absolute: 0 10*3/uL (ref 0.0–0.5)
Eosinophils Relative: 1 %
HCT: 45.1 % (ref 36.0–46.0)
Hemoglobin: 15.2 g/dL — ABNORMAL HIGH (ref 12.0–15.0)
Immature Granulocytes: 0 %
Lymphocytes Relative: 41 %
Lymphs Abs: 2 10*3/uL (ref 0.7–4.0)
MCH: 29.2 pg (ref 26.0–34.0)
MCHC: 33.7 g/dL (ref 30.0–36.0)
MCV: 86.7 fL (ref 80.0–100.0)
Monocytes Absolute: 0.5 10*3/uL (ref 0.1–1.0)
Monocytes Relative: 10 %
Neutro Abs: 2.3 10*3/uL (ref 1.7–7.7)
Neutrophils Relative %: 46 %
Platelets: 277 10*3/uL (ref 150–400)
RBC: 5.2 MIL/uL — ABNORMAL HIGH (ref 3.87–5.11)
RDW: 17.1 % — ABNORMAL HIGH (ref 11.5–15.5)
WBC: 4.9 10*3/uL (ref 4.0–10.5)
nRBC: 0 % (ref 0.0–0.2)

## 2018-12-20 LAB — BASIC METABOLIC PANEL
Anion gap: 13 (ref 5–15)
BUN: 26 mg/dL — ABNORMAL HIGH (ref 6–20)
CO2: 13 mmol/L — ABNORMAL LOW (ref 22–32)
Calcium: 8.9 mg/dL (ref 8.9–10.3)
Chloride: 113 mmol/L — ABNORMAL HIGH (ref 98–111)
Creatinine, Ser: 1.15 mg/dL — ABNORMAL HIGH (ref 0.44–1.00)
GFR calc Af Amer: >60 mL/min (ref >60)
GFR calc non Af Amer: >60 mL/min (ref >60)
Glucose, Bld: 85 mg/dL (ref 70–99)
Potassium: 4.4 mmol/L (ref 3.5–5.1)
Sodium: 139 mmol/L (ref 135–145)

## 2018-12-20 LAB — URINALYSIS, COMPLETE (UACMP) WITH MICROSCOPIC
Bacteria, UA: NONE SEEN
Bilirubin Urine: NEGATIVE
Glucose, UA: NEGATIVE mg/dL
Ketones, ur: 5 mg/dL — AB
Leukocytes,Ua: NEGATIVE
Nitrite: NEGATIVE
Protein, ur: 30 mg/dL — AB
Specific Gravity, Urine: 1.04 — ABNORMAL HIGH (ref 1.005–1.030)
pH: 5 (ref 5.0–8.0)

## 2018-12-20 LAB — POCT PREGNANCY, URINE: Preg Test, Ur: NEGATIVE

## 2018-12-20 NOTE — ED Triage Notes (Signed)
Pt arrives ambulatory to triage with c/o HA x 2 days. Pt states that she has tried taking BC powder and tylenol with no relief. Pt is in NAD.

## 2018-12-21 ENCOUNTER — Emergency Department
Admission: EM | Admit: 2018-12-21 | Discharge: 2018-12-21 | Disposition: A | Discharge status: Home / Self Care | Payer: Self-pay | Attending: Emergency Medicine | Admitting: Emergency Medicine

## 2018-12-21 DIAGNOSIS — R519 Headache, unspecified: Secondary | ICD-10-CM

## 2018-12-21 MED ORDER — METOCLOPRAMIDE HCL 5 MG/ML IJ SOLN
10.0000 mg | Freq: Once | INTRAMUSCULAR | Status: AC
  Administered 2018-12-21: 10 mg via INTRAVENOUS
  Filled 2018-12-21: qty 2

## 2018-12-21 MED ORDER — PREDNISONE 10 MG (21) PO TBPK: ORAL_TABLET | ORAL | 0 refills | Status: DC

## 2018-12-21 MED ORDER — BUTALBITAL-APAP-CAFFEINE 50-325-40 MG PO TABS: 1.0000 | ORAL_TABLET | Freq: Four times a day (QID) | ORAL | 0 refills | Status: DC | PRN

## 2018-12-21 MED ORDER — KETOROLAC TROMETHAMINE 30 MG/ML IJ SOLN
30.0000 mg | Freq: Once | INTRAMUSCULAR | Status: AC
  Administered 2018-12-21: 30 mg via INTRAVENOUS
  Filled 2018-12-21: qty 1

## 2018-12-21 MED ORDER — AMOXICILLIN-POT CLAVULANATE 875-125 MG PO TABS: 1.0000 | ORAL_TABLET | Freq: Two times a day (BID) | ORAL | 0 refills | Status: AC

## 2018-12-21 NOTE — ED Notes (Signed)
Assessment: had to awake pt from sleep. Pt states frontal headache today with nausea. Pt denies known fever but states has had chills for last 2 days. Pt appears in no acute distress. perrl 9mm and brisk.

## 2018-12-21 NOTE — ED Notes (Signed)
Patient to stat desk in no acute distress asking about wait time. Patient verbalizes understanding.  

## 2018-12-21 NOTE — ED Provider Notes (Signed)
[facility] Emergency Department Provider Note    First MD Initiated Contact with Patient 12/21/18 [phone]     (approximate)  I have reviewed the triage vital signs and the nursing notes.   HISTORY  Chief Complaint Headache    HPI Alicia Mccarthy is a 28 y.o. female with below list of previous medical conditions presents with 2-day history of frontal headache unrelieved with BC powder Tylenol and ibuprofen at home.  Patient states that she has had similar headaches in the past for which she was diagnosed with a sinus headache.  Patient does admit to nasal congestion as well.  No visual changes no weakness numbness gait instability or nausea.  Patient states current pain score is 9 out of 10.        Past Medical History:  Diagnosis Date   Anxiety    Depression    Herpes simplex    Migraines    PID (acute pelvic inflammatory disease)     There are no active problems to display for this patient.   Past Surgical History:  Procedure Laterality Date   CESAREAN SECTION      Prior to Admission medications   Medication Sig Start Date End Date Taking? Authorizing Provider  amoxicillin-clavulanate (AUGMENTIN) 875-125 MG tablet Take 1 tablet by mouth 2 (two) times daily for 10 days. 12/21/18 12/31/18  Darci CurrentBrown, [place] N, MD  butalbital-acetaminophen-caffeine [facility](FIORICET) [phone] MG tablet Take 1 tablet by mouth every 6 (six) hours as needed for headache. 12/21/18 12/21/19  Darci CurrentBrown, [place] N, MD  fluticasone [facility](FLONASE) 50 MCG/ACT nasal spray Place 1 spray into both nostrils 2 (two) times daily. 06/11/17   Cuthriell, Delorise RoyalsJonathan D, PA-C  magic mouthwash w/lidocaine SOLN Take 5 mLs by mouth 4 (four) times daily. 06/11/17   Cuthriell, Delorise RoyalsJonathan D, PA-C  meloxicam (MOBIC) 15 MG tablet Take 1 tablet (15 mg total) by mouth daily. 06/11/17   Cuthriell, Delorise RoyalsJonathan D, PA-C  methocarbamol (ROBAXIN) 500 MG tablet Take 1 tablet (500 mg total) by mouth 4 (four) times daily.  06/11/17   Cuthriell, Delorise RoyalsJonathan D, PA-C  predniSONE (STERAPRED UNI-PAK 21 TAB) 10 MG (21) TBPK tablet Take 6 tablets the first day, take 5 tablets the second day, take 4 tablets the third day, take 3 tablets the fourth day, take 2 tablets the fifth day, take 1 tablet the sixth day. 12/21/18   Darci CurrentBrown, [place] N, MD    Allergies Tramadol and Vicodin [hydrocodone-acetaminophen]  No family history on file.  Social History Social History   Tobacco Use   Smoking status: Current Every Day Smoker    Packs/day: 0.50    Types: Cigarettes   Smokeless tobacco: Never Used  Substance Use Topics   Alcohol use: No   Drug use: No    Review of Systems Constitutional: No fever/chills Eyes: No visual changes. ENT: No sore throat. Cardiovascular: Denies chest pain. Respiratory: Denies shortness of breath. Gastrointestinal: No abdominal pain.  No nausea, no vomiting.  No diarrhea.  No constipation. Genitourinary: Negative for dysuria. Musculoskeletal: Negative for neck pain.  Negative for back pain. Integumentary: Negative for rash. Neurological: Positive for headaches, negative for focal weakness or numbness.   ____________________________________________   PHYSICAL EXAM:  VITAL SIGNS: ED Triage Vitals  Enc Vitals Group     BP 12/20/18 2159 113/76     Pulse Rate 12/20/18 2159 (!) 114     Resp 12/20/18 2159 18     Temp 12/20/18 2159 98.2 F (36.8 C)  Temp Source 12/20/18 2159 Oral     SpO2 12/20/18 2159 99 %     Weight 12/20/18 2200 49.9 kg (110 lb)     Height 12/20/18 2200 1.575 m (5\' 2" )     Head Circumference --      Peak Flow --      Pain Score 12/20/18 2203 7     Pain Loc --      Pain Edu? --      Excl. in [place]? --     Constitutional: Alert and oriented. Well appearing and in no acute distress. Eyes: Conjunctivae are normal.  Head: Pain to palpation of the left maxillary and frontal sinus Mouth/Throat: Mucous membranes are moist.  Oropharynx non-erythematous.* Neck:  No stridor.  No meningeal signs.   Cardiovascular: Normal rate, regular rhythm. Good peripheral circulation. Grossly normal heart sounds. Respiratory: Normal respiratory effort.  No retractions. No audible wheezing. Gastrointestinal: Soft and nontender. No distention.  Musculoskeletal: No lower extremity tenderness nor edema. No gross deformities of extremities. Neurologic:  Normal speech and language. No gross focal neurologic deficits are appreciated.  Skin:  Skin is warm, dry and intact. No rash noted. Psychiatric: Mood and affect are normal. Speech and behavior are normal.  ____________________________________________   LABS (all labs ordered are listed, but only abnormal results are displayed)  Labs Reviewed  URINALYSIS, COMPLETE (UACMP) WITH MICROSCOPIC - Abnormal; Notable for the following components:      Result Value   Color, Urine YELLOW (*)    APPearance CLEAR (*)    Specific Gravity, Urine 1.040 (*)    Hgb urine dipstick SMALL (*)    Ketones, ur 5 (*)    Protein, ur 30 (*)    All other components within normal limits  CBC WITH DIFFERENTIAL/PLATELET - Abnormal; Notable for the following components:   RBC 5.20 (*)    Hemoglobin 15.2 (*)    RDW 17.1 (*)    All other components within normal limits  BASIC METABOLIC PANEL - Abnormal; Notable for the following components:   Chloride 113 (*)    CO2 13 (*)    BUN 26 (*)    Creatinine, Ser 1.15 (*)    All other components within normal limits  POCT PREGNANCY, URINE  POC URINE PREG, ED     Procedures   ____________________________________________   INITIAL IMPRESSION / MDM / [place] / ED COURSE  As part of my medical decision making, I reviewed the following data within the electronic MEDICAL RECORD NUMBER 28 year old female presenting with above-stated history and physical exam secondary to "sinus headache".  Patient given Toradol and Reglan in the emergency department with improvement of pain.  Patient be  prescribed Augmentin and she requested steroids though she states that she usually requires this to treat her sinusitis and Fioricet for home.     *Waldon Merl was evaluated in Emergency Department on 12/21/2018 for the symptoms described in the history of present illness. She was evaluated in the context of the global COVID-19 pandemic, which necessitated consideration that the patient might be at risk for infection with the SARS-CoV-2 virus that causes COVID-19. Institutional protocols and algorithms that pertain to the evaluation of patients at risk for COVID-19 are in a state of rapid change based on information released by regulatory bodies including the CDC and federal and state organizations. These policies and algorithms were followed during the patient's care in the ED.  Some ED evaluations and interventions may be delayed as a result  of limited staffing during the pandemic.*    ____________________________________________  FINAL CLINICAL IMPRESSION(S) / ED DIAGNOSES  Final diagnoses:  Sinus headache     MEDICATIONS GIVEN DURING THIS VISIT:  Medications  ketorolac (TORADOL) 30 MG/ML injection 30 mg (30 mg Intravenous Given 12/21/18 0525)  metoCLOPramide (REGLAN) injection 10 mg (10 mg Intravenous Given 12/21/18 0522)     ED Discharge Orders         Ordered    predniSONE (STERAPRED UNI-PAK 21 TAB) 10 MG (21) TBPK tablet     12/21/18 0618    amoxicillin-clavulanate (AUGMENTIN) 875-125 MG tablet  2 times daily     12/21/18 0618    butalbital-acetaminophen-caffeine (FIORICET) 50-325-40 MG tablet  Every 6 hours PRN     12/21/18 0618           Note:  This document was prepared using Dragon voice recognition software and may include unintentional dictation errors.   Darci CurrentBrown, [place] N, MD 12/21/18 [phone]

## 2019-01-30 ENCOUNTER — Encounter: Payer: Self-pay | Admitting: Emergency Medicine

## 2019-01-30 ENCOUNTER — Emergency Department
Admission: EM | Admit: 2019-01-30 | Discharge: 2019-01-30 | Disposition: A | Discharge status: Home / Self Care | Payer: Medicaid Other | Attending: Emergency Medicine | Admitting: Emergency Medicine

## 2019-01-30 ENCOUNTER — Other Ambulatory Visit: Payer: Self-pay

## 2019-01-30 DIAGNOSIS — F17210 Nicotine dependence, cigarettes, uncomplicated: Secondary | ICD-10-CM | POA: Insufficient documentation

## 2019-01-30 DIAGNOSIS — Z885 Allergy status to narcotic agent status: Secondary | ICD-10-CM | POA: Insufficient documentation

## 2019-01-30 DIAGNOSIS — Z79899 Other long term (current) drug therapy: Secondary | ICD-10-CM | POA: Insufficient documentation

## 2019-01-30 DIAGNOSIS — G43001 Migraine without aura, not intractable, with status migrainosus: Secondary | ICD-10-CM | POA: Insufficient documentation

## 2019-01-30 MED ORDER — BUTALBITAL-APAP-CAFFEINE 50-325-40 MG PO TABS: 1.0000 | ORAL_TABLET | Freq: Four times a day (QID) | ORAL | 0 refills | Status: AC | PRN

## 2019-01-30 MED ORDER — KETOROLAC TROMETHAMINE 30 MG/ML IJ SOLN
30.0000 mg | Freq: Once | INTRAMUSCULAR | Status: AC
  Administered 2019-01-30: 08:00:00 30 mg via INTRAVENOUS
  Filled 2019-01-30: qty 1

## 2019-01-30 MED ORDER — SODIUM CHLORIDE 0.9 % IV BOLUS
1000.0000 mL | Freq: Once | INTRAVENOUS | Status: AC
  Administered 2019-01-30: 08:00:00 1000 mL via INTRAVENOUS

## 2019-01-30 MED ORDER — METOCLOPRAMIDE HCL 5 MG/ML IJ SOLN
20.0000 mg | Freq: Once | INTRAVENOUS | Status: AC
  Administered 2019-01-30: 20 mg via INTRAVENOUS
  Filled 2019-01-30: qty 4

## 2019-01-30 MED ORDER — ORPHENADRINE CITRATE 30 MG/ML IJ SOLN
30.0000 mg | Freq: Two times a day (BID) | INTRAMUSCULAR | Status: DC
  Administered 2019-01-30: 08:00:00 30 mg via INTRAVENOUS
  Filled 2019-01-30: qty 2

## 2019-01-30 NOTE — ED Triage Notes (Signed)
Pt sates migraine for 2 days, OTC meds not helping. Pt reports is in the process of getting an appointment to get medication for migraine. States that the last time she was here whatever medication they wrote her in the prescription is what helped.

## 2019-01-30 NOTE — ED Provider Notes (Signed)
[facility] Emergency Department Provider Note   ____________________________________________   First MD Initiated Contact with Patient 01/30/19 0732     (approximate)  I have reviewed the triage vital signs and the nursing notes.   HISTORY  Chief Complaint Headache and Migraine    HPI Alicia Mccarthy is a 28 y.o. female patient presents with 2 days of frontal headache unrelieved with over-the-counter anti-inflammatory medications.  Patient states photophobic,  mild nausea, without vomiting..  Patient also complains of a tightness across the upper back.  Patient rates her pain discomfort as 6/10.  Patient described the pain is "achy".  Patient denies vertigo or weakness.  Patient denies URI signs and symptoms.         Past Medical History:  Diagnosis Date  . Anxiety   . Depression   . Herpes simplex   . Migraines   . PID (acute pelvic inflammatory disease)     There are no active problems to display for this patient.   Past Surgical History:  Procedure Laterality Date  . CESAREAN SECTION      Prior to Admission medications   Medication Sig Start Date End Date Taking? Authorizing Provider  butalbital-acetaminophen-caffeine (FIORICET) [phone] MG tablet Take 1 tablet by mouth every 6 (six) hours as needed for headache. 01/30/19 01/30/20  Joni ReiningSmith, [place] Pelfrey K, PA-C  fluticasone [facility](FLONASE) 50 MCG/ACT nasal spray Place 1 spray into both nostrils 2 (two) times daily. 06/11/17   Cuthriell, Delorise RoyalsJonathan D, PA-C  magic mouthwash w/lidocaine SOLN Take 5 mLs by mouth 4 (four) times daily. 06/11/17   Cuthriell, Delorise RoyalsJonathan D, PA-C  meloxicam (MOBIC) 15 MG tablet Take 1 tablet (15 mg total) by mouth daily. 06/11/17   Cuthriell, Delorise RoyalsJonathan D, PA-C  methocarbamol (ROBAXIN) 500 MG tablet Take 1 tablet (500 mg total) by mouth 4 (four) times daily. 06/11/17   Cuthriell, Delorise RoyalsJonathan D, PA-C  predniSONE (STERAPRED UNI-PAK 21 TAB) 10 MG (21) TBPK tablet Take 6 tablets the first  day, take 5 tablets the second day, take 4 tablets the third day, take 3 tablets the fourth day, take 2 tablets the fifth day, take 1 tablet the sixth day. 12/21/18   Darci CurrentBrown, [place] N, MD    Allergies Tramadol and Vicodin [hydrocodone-acetaminophen]  No family history on file.  Social History Social History   Tobacco Use  . Smoking status: Current Every Day Smoker    Packs/day: 0.50    Types: Cigarettes  . Smokeless tobacco: Never Used  Substance Use Topics  . Alcohol use: No  . Drug use: No    Review of Systems Constitutional: No fever/chills Eyes: No visual changes. ENT: No sore throat. Cardiovascular: Denies chest pain. Respiratory: Denies shortness of breath. Gastrointestinal: No abdominal pain.  No nausea, no vomiting.  No diarrhea.  No constipation. Genitourinary: Negative for dysuria. Musculoskeletal: Upper back pain. Skin: Negative for rash. Neurological: Positive for headaches, but denies focal weakness or numbness. Psychiatric:  Anxiety depression. Allergic/Immunilogical: Tramadol and Vicodin. ____________________________________________   PHYSICAL EXAM:  VITAL SIGNS: ED Triage Vitals  Enc Vitals Group     BP 01/30/19 0715 (!) 107/54     Pulse Rate 01/30/19 0715 71     Resp 01/30/19 0715 18     Temp 01/30/19 0715 (!) 97.5 F (36.4 C)     Temp Source 01/30/19 0715 Oral     SpO2 01/30/19 0715 100 %     Weight 01/30/19 0711 96 lb (43.5 kg)     Height 01/30/19 0711  5\' 2"  (1.575 m)     Head Circumference --      Peak Flow --      Pain Score 01/30/19 0711 6     Pain Loc --      Pain Edu? --      Excl. in [place]? --     Constitutional: Alert and oriented. Well appearing and in no acute distress. Eyes: Photophobic.   Head: Atraumatic. Nose: No congestion/rhinnorhea. Mouth/Throat: Mucous membranes are moist.  Oropharynx non-erythematous. Neck: No stridor.  No cervical spine tenderness to palpation. Cardiovascular: Normal rate, regular rhythm. Grossly  normal heart sounds.  Good peripheral circulation. Respiratory: Normal respiratory effort.  No retractions. Lungs CTAB. Gastrointestinal: Soft and nontender. No distention.  Musculoskeletal: Moderate guarding palpation bilateral superior scapular area.   Neurologic:  Normal speech and language. No gross focal neurologic deficits are appreciated. No gait instability. Skin:  Skin is warm, dry and intact. No rash noted. Psychiatric: Mood and affect are normal. Speech and behavior are normal.  ____________________________________________   LABS (all labs ordered are listed, but only abnormal results are displayed)  Labs Reviewed - No data to display ____________________________________________  EKG   ____________________________________________  RADIOLOGY  ED MD interpretation:    Official radiology report(s): No results found.  ____________________________________________   PROCEDURES  Procedure(s) performed (including Critical Care):  Procedures   ____________________________________________   INITIAL IMPRESSION / ASSESSMENT AND PLAN / ED COURSE  As part of my medical decision making, I reviewed the following data within the [place] was evaluated in Emergency Department on 01/30/2019 for the symptoms described in the history of present illness. She was evaluated in the context of the global COVID-19 pandemic, which necessitated consideration that the patient might be at risk for infection with the SARS-CoV-2 virus that causes COVID-19. Institutional protocols and algorithms that pertain to the evaluation of patients at risk for COVID-19 are in a state of rapid change based on information released by regulatory bodies including the CDC and federal and state organizations. These policies and algorithms were followed during the patient's care in the ED.  Patient presents with 2 days of migraine headache.  Patient responded well  to rehydration, Toradol, Reglan, and Norflex.  Advised to follow-up with new PCP and take medication as directed.      ____________________________________________   FINAL CLINICAL IMPRESSION(S) / ED DIAGNOSES  Final diagnoses:  Migraine without aura and with status migrainosus, not intractable     ED Discharge Orders         Ordered    butalbital-acetaminophen-caffeine (FIORICET) 50-325-40 MG tablet  Every 6 hours PRN     01/30/19 0748           Note:  This document was prepared using Dragon voice recognition software and may include unintentional dictation errors.    Sable Feil, PA-C 01/30/19 8182    Harvest Dark, MD 01/30/19 2228

## 2019-01-30 NOTE — ED Notes (Signed)
Pt states that she has had a headache for the past 2 days. She states that the pain is across her forehead, and down her neck into her shoulders. Pt states that OTC medications have not provided any relief.
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
## 2019-10-06 ENCOUNTER — Other Ambulatory Visit: Payer: Self-pay

## 2019-10-06 ENCOUNTER — Emergency Department
Admission: EM | Admit: 2019-10-06 | Discharge: 2019-10-06 | Disposition: A | Discharge status: Home / Self Care | Payer: Medicaid Other | Attending: Emergency Medicine | Admitting: Emergency Medicine

## 2019-10-06 DIAGNOSIS — R1310 Dysphagia, unspecified: Secondary | ICD-10-CM | POA: Insufficient documentation

## 2019-10-06 DIAGNOSIS — F17210 Nicotine dependence, cigarettes, uncomplicated: Secondary | ICD-10-CM | POA: Insufficient documentation

## 2019-10-06 DIAGNOSIS — R590 Localized enlarged lymph nodes: Secondary | ICD-10-CM | POA: Insufficient documentation

## 2019-10-06 DIAGNOSIS — R0989 Other specified symptoms and signs involving the circulatory and respiratory systems: Secondary | ICD-10-CM

## 2019-10-06 DIAGNOSIS — J029 Acute pharyngitis, unspecified: Secondary | ICD-10-CM

## 2019-10-06 DIAGNOSIS — J302 Other seasonal allergic rhinitis: Secondary | ICD-10-CM | POA: Insufficient documentation

## 2019-10-06 DIAGNOSIS — K219 Gastro-esophageal reflux disease without esophagitis: Secondary | ICD-10-CM | POA: Insufficient documentation

## 2019-10-06 MED ORDER — CETIRIZINE HCL 10 MG PO TABS: 10.0000 mg | ORAL_TABLET | Freq: Every day | ORAL | 0 refills | Status: DC

## 2019-10-06 NOTE — ED Notes (Signed)
Pt is ambulatory to the room, in no acute distress.  Pt reports left side of throat is swollen, began about 3 days ago.  States it feels like there is something coating her throat, makes it difficult to swallow, particularly on the left side.  Denies pain.  Pt states she has still been able to eat and drink.

## 2019-10-06 NOTE — ED Provider Notes (Signed)
[facility] Emergency Department Provider Note  ____________________________________________  Time seen: Approximately 9:41 PM  I have reviewed the triage vital signs and the nursing notes.   HISTORY  Chief Complaint Dysphagia    HPI Alicia Mccarthy is a 29 y.o. female who presents to the emergency department for treatment and evaluation of dysphagia x 3 days. She   states that she had felt a swollen lymph node on the left side of her neck and then today she noticed some bumps on the back of her tongue and feels a thickness type coating in the back of her throat that she cannot swallow or cough up.  She denies fever, nausea, vomiting, diarrhea, cough, shortness of breath, decrease in taste or smell.  No alleviating measures have been attempted prior to arrival.  Past Medical History:  Diagnosis Date  . Anxiety   . Depression   . Herpes simplex   . Migraines   . PID (acute pelvic inflammatory disease)     There are no problems to display for this patient.   Past Surgical History:  Procedure Laterality Date  . CESAREAN SECTION      Prior to Admission medications   Medication Sig Start Date End Date Taking? Authorizing Provider  butalbital-acetaminophen-caffeine (FIORICET) [phone] MG tablet Take 1 tablet by mouth every 6 (six) hours as needed for headache. 01/30/19 01/30/20  Joni Reining, PA-C  fluticasone [facility]) 50 MCG/ACT nasal spray Place 1 spray into both nostrils 2 (two) times daily. 06/11/17   Cuthriell, Delorise Royals, PA-C  magic mouthwash w/lidocaine SOLN Take 5 mLs by mouth 4 (four) times daily. 06/11/17   Cuthriell, Delorise Royals, PA-C  meloxicam (MOBIC) 15 MG tablet Take 1 tablet (15 mg total) by mouth daily. 06/11/17   Cuthriell, Delorise Royals, PA-C  methocarbamol (ROBAXIN) 500 MG tablet Take 1 tablet (500 mg total) by mouth 4 (four) times daily. 06/11/17   Cuthriell, Delorise Royals, PA-C  predniSONE (STERAPRED UNI-PAK 21 TAB) 10 MG (21) TBPK  tablet Take 6 tablets the first day, take 5 tablets the second day, take 4 tablets the third day, take 3 tablets the fourth day, take 2 tablets the fifth day, take 1 tablet the sixth day. 12/21/18   Darci Current, MD    Allergies Tramadol and Vicodin [hydrocodone-acetaminophen]  No family history on file.  Social History Social History   Tobacco Use  . Smoking status: Current Every Day Smoker    Packs/day: 0.50    Types: Cigarettes  . Smokeless tobacco: Never Used  Substance Use Topics  . Alcohol use: No  . Drug use: No    Review of Systems Constitutional: Negative for fever. Eyes: No visual changes. ENT: Positive for sore throat; negative for difficulty swallowing. Respiratory: Denies shortness of breath. Gastrointestinal: Negative for abdominal pain.  No nausea, no vomiting.  No diarrhea.  Genitourinary: Negative for dysuria.  Negative for decrease in need to void. Musculoskeletal: Negative for generalized body aches. Skin: Negative for rash. Neurological: Negative for headaches, negative for focal weakness or numbness.  ____________________________________________   PHYSICAL EXAM:  VITAL SIGNS: ED Triage Vitals  Enc Vitals Group     BP 10/06/19 2040 (!) 113/93     Pulse Rate 10/06/19 2040 (!) 113     Resp 10/06/19 2040 16     Temp 10/06/19 2040 98.4 F (36.9 C)     Temp Source 10/06/19 2040 Oral     SpO2 10/06/19 2040 100 %  Weight 10/06/19 2041 105 lb (47.6 kg)     Height 10/06/19 2041 5\' 2"  (1.575 m)     Head Circumference --      Peak Flow --      Pain Score 10/06/19 2044 0     Pain Loc --      Pain Edu? --      Excl. in [place]? --     Constitutional: Alert and oriented. Well appearing and in no acute distress. Eyes: Conjunctivae are normal.  Head: Atraumatic. Nose: No congestion/rhinnorhea. Mouth/Throat: Mucous membranes are moist.  Oropharynx with cobblestone exudate in the posterior oropharynx., tonsils flat without exudate. Uvula is midline.  Tongue does have some raised taste buds. Ears: Right tympanic membrane appears normal.  Left tympanic membrane appears normal. Neck: No stridor. Voice clear, not muffled Lymphatic: Anterior cervical nodes palpable but nontender on the left side. Cardiovascular: Normal rate, regular rhythm. Good peripheral circulation. Respiratory: Normal respiratory effort. Lungs CTAB. Gastrointestinal: Soft and nontender. Musculoskeletal: FROM of neck, upper and lower extremities. Neurologic:  Normal speech and language. No gross focal neurologic deficits are appreciated. Skin:  Skin is warm, dry and intact.  No rash noted Psychiatric: Mood and affect are normal. Speech and behavior are normal.  ____________________________________________   LABS (all labs ordered are listed, but only abnormal results are displayed)  Labs Reviewed - No data to display ____________________________________________  EKG  Not indiated ____________________________________________  RADIOLOGY  Not indicated ____________________________________________   PROCEDURES  Procedure(s) performed: None  Critical Care performed: No ____________________________________________   INITIAL IMPRESSION / ASSESSMENT AND PLAN / ED COURSE  29 year old female presenting to the emergency department for complaints as described in the HPI.  Exam is overall very reassuring.  She does have a history of GERD and seasonal allergies.  She will be encouraged to continue her PPI and will take cetirizine.  If not improving over the next several days, she is to see her primary care provider.  If her symptoms of change or worsen she is to return to the emergency department.  Pertinent labs & imaging results that were available during my care of the patient were reviewed by me and considered in my medical decision making (see chart for details). ____________________________________________  New Prescriptions   No medications on file     FINAL CLINICAL IMPRESSION(S) / ED DIAGNOSES  Final diagnoses:  None    If controlled substance prescribed during this visit, 12 month history viewed on the [place] prior to issuing an initial prescription for Schedule II or III opiod.   Note:  This document was prepared using Dragon voice recognition software and may include unintentional dictation errors.   Victorino Dike, FNP 10/06/19 7628    Nance Pear, MD 10/06/19 [phone]

## 2019-10-06 NOTE — ED Triage Notes (Signed)
Pt arrives to ED c/o difficulty swallowing, Pt states a few days ago she noticed what she thought to be a swollen lymph node on left side of neck, pt states she noticed bumps on back on tongue, now feels like she has a "thickness" coating back fo throat that she cant swallow down. Denies difficulty breathing. Pt is speaking in complete sentences with no distress.

## 2019-10-06 NOTE — Discharge Instructions (Signed)
Please follow up with your primary care provider if not improving over the next few days.  Take your reflux medication for the next couple of weeks as well as the allergy medication prescribed.  Return to the ER for symptoms that change or worsen if unable to schedule an appointment.

## 2020-08-16 ENCOUNTER — Encounter: Payer: Self-pay | Admitting: Emergency Medicine

## 2020-08-16 ENCOUNTER — Other Ambulatory Visit: Payer: Self-pay

## 2020-08-16 DIAGNOSIS — F1124 Opioid dependence with opioid-induced mood disorder: Secondary | ICD-10-CM | POA: Insufficient documentation

## 2020-08-16 DIAGNOSIS — F17210 Nicotine dependence, cigarettes, uncomplicated: Secondary | ICD-10-CM | POA: Insufficient documentation

## 2020-08-16 NOTE — ED Triage Notes (Signed)
Pt presents via POV with c/o wanting detox. Pt states she would like help detoxing from opioids. Pt states last use was Saturday. Pt also prescribed klonipin, but patient denies abusing prescribed medications. Pt denies any other substance or alcohol use. Pt alert and cooperative during triage. Pt denies SI/HI, AH/VH.

## 2020-08-17 ENCOUNTER — Emergency Department
Admission: EM | Admit: 2020-08-17 | Discharge: 2020-08-17 | Disposition: A | Discharge status: Home / Self Care | Payer: Medicaid Other | Attending: Emergency Medicine | Admitting: Emergency Medicine

## 2020-08-17 DIAGNOSIS — F1110 Opioid abuse, uncomplicated: Secondary | ICD-10-CM

## 2020-08-17 MED ORDER — CLONIDINE HCL 0.1 MG/24HR TD PTWK
0.1000 mg | MEDICATED_PATCH | TRANSDERMAL | Status: DC
  Administered 2020-08-17: 0.1 mg via TRANSDERMAL
  Filled 2020-08-17: qty 1

## 2020-08-17 NOTE — BH Assessment (Signed)
Referral information for Detox treatment faxed to;   . [facility] [phone])

## 2020-08-17 NOTE — BH Assessment (Signed)
Comprehensive Clinical Assessment (CCA) Note  08/17/2020 Alicia FinesShannon L Mccarthy [id number]  Chief Complaint: Patient is a 30 year old female presenting [facility] ED voluntarily seeking detox treatment. Pt presents via POV with c/o wanting detox. Pt states she would like help detoxing from opioids. Pt states last use was Saturday. Pt also prescribed klonipin, but patient denies abusing prescribed medications. Pt denies any other substance or alcohol use. Pt alert and cooperative during triage. Pt denies SI/HI, AH/VH. During assessment patient appears alert and oriented x4, calm and cooperative. Patient reports use of Opioids. Patient reports age of first use being 30 years old. Patient reports daily use and reports amounts of 20-40mg . Patent reports current withdrawal symptoms of "achey stomach, hot flashes, cold chills, when I eat it feels like I'm suffocating." Patient reports her last use being Saturday 2.12.22. Patient also reports lack of sleep and appetite. Patient denies SI/HI/AH/VH and does not appear to be responding to any internal or external stimuli.  Patient is requesting detox treatment and would like referral to be sent to [facility]. Patient was also given information for Outpatient treatment at [facility] if patient did not wish to wait on a bed for detox Chief Complaint  Patient presents with  . detox   Visit Diagnosis: Opioid Use Disorder, Severe   CCA Screening, Triage and Referral (STR)  Patient Reported Information How did you hear about us? Self  Referral name: No data recorded Referral phone number: No data recorded  Whom do you see for routine medical problems? Other (Comment)  Practice/Facility Name: No data recorded Practice/Facility Phone Number: No data recorded Name of Contact: No data recorded Contact Number: No data recorded Contact Fax Number: No data recorded Prescriber Name: No data recorded Prescriber Address (if known): No data recorded  What Is the Reason for Your  Visit/Call Today? No data recorded How Long Has This Been Causing You Problems? > than 6 months  What Do You Feel Would Help You the Most Today? Assessment Only; Therapy; Medication   Have You Recently Been in Any Inpatient Treatment (Hospital/Detox/Crisis Center/28-Day Program)? No  Name/Location of Program/Hospital:No data recorded How Long Were You There? No data recorded When Were You Discharged? No data recorded  Have You Ever Received Services From [facility] Before? No  Who Do You See at [facility]? No data recorded  Have You Recently Had Any Thoughts About Hurting Yourself? No  Are You Planning to Commit Suicide/Harm Yourself At This time? No   Have you Recently Had Thoughts About Hurting Someone Karolee Ohslse? No  Explanation: No data recorded  Have You Used Any Alcohol or Drugs in the Past 24 Hours? No  How Long Ago Did You Use Drugs or Alcohol? No data recorded What Did You Use and How Much? No data recorded  Do You Currently Have a Therapist/Psychiatrist? No  Name of Therapist/Psychiatrist: No data recorded  Have You Been Recently Discharged From Any Office Practice or Programs? No  Explanation of Discharge From Practice/Program: No data recorded    CCA Screening Triage Referral Assessment Type of Contact: Face-to-Face  Is this Initial or Reassessment? No data recorded Date Telepsych consult ordered in [facility]:  No data recorded Time Telepsych consult ordered in [facility]:  No data recorded  Patient Reported Information Reviewed? Yes  Patient Left Without Being Seen? No data recorded Reason for Not Completing Assessment: No data recorded  Collateral Involvement: No data recorded  Does Patient Have a Court Appointed Legal Guardian? No data recorded Name and Contact  of Legal Guardian: No data recorded If Minor and Not Living with Parent(s), Who has Custody? No data recorded Is CPS involved or ever been involved? Never  Is APS involved or ever been involved?  Never   Patient Determined To Be At Risk for Harm To Self or Others Based on Review of Patient Reported Information or Presenting Complaint? No  Method: No data recorded Availability of Means: No data recorded Intent: No data recorded Notification Required: No data recorded Additional Information for Danger to Others Potential: No data recorded Additional Comments for Danger to Others Potential: No data recorded Are There Guns or Other Weapons in Your Home? No data recorded Types of Guns/Weapons: No data recorded Are These Weapons Safely Secured?                            No data recorded Who Could Verify You Are Able To Have These Secured: No data recorded Do You Have any Outstanding Charges, Pending Court Dates, Parole/Probation? No data recorded Contacted To Inform of Risk of Harm To Self or Others: No data recorded  Location of Assessment: [facility] ED   Does Patient Present under Involuntary Commitment? No  IVC Papers Initial File Date: No data recorded  [place] of Residence: [place]   Patient Currently Receiving the Following Services: No data recorded  Determination of Need: Emergent (2 hours)   Options For Referral: No data recorded    CCA Biopsychosocial Intake/Chief Complaint:  Patient is presenting for detox treatment  Current Symptoms/Problems: Patient is presenting for detox treatment   Patient Reported Schizophrenia/Schizoaffective Diagnosis in Past: No   Strengths: Unknown  Preferences: Unknown  Abilities: Unknown   Type of Services Patient Feels are Needed: Unknown   Initial Clinical Notes/Concerns: None   Mental Health Symptoms Depression:  None   Duration of Depressive symptoms: No data recorded  Mania:  None   Anxiety:   None   Psychosis:  None   Duration of Psychotic symptoms: No data recorded  Trauma:  None   Obsessions:  None   Compulsions:  None   Inattention:  None   Hyperactivity/Impulsivity:  N/A    Oppositional/Defiant Behaviors:  None   Emotional Irregularity:  None   Other Mood/Personality Symptoms:  No data recorded   Mental Status Exam Appearance and self-care  Stature:  Average   Weight:  Average weight   Clothing:  Casual   Grooming:  Normal   Cosmetic use:  None   Posture/gait:  Normal   Motor activity:  Not Remarkable   Sensorium  Attention:  Normal   Concentration:  Normal   Orientation:  X5   Recall/memory:  Normal   Affect and Mood  Affect:  Not Congruent   Mood:  Other (Comment) (Pleasant)   Relating  Eye contact:  Normal   Facial expression:  Responsive   Attitude toward examiner:  Cooperative   Thought and Language  Speech flow: Clear and Coherent   Thought content:  Appropriate to Mood and Circumstances   Preoccupation:  None   Hallucinations:  None   Organization:  No data recorded  [employer] of Knowledge:  Good   Intelligence:  Average   Abstraction:  Normal   Judgement:  Good   Reality Testing:  Realistic   Insight:  Good   Decision Making:  Normal   Social Functioning  Social Maturity:  Responsible   Social Judgement:  Normal   Stress  Stressors:  Other (Comment)   Coping Ability:  Normal   Skill Deficits:  None   Supports:  Family     Religion: Religion/Spirituality Are You A Religious Person?: No  Leisure/Recreation: Leisure / Recreation Do You Have Hobbies?: No  Exercise/Diet: Exercise/Diet Do You Exercise?: No Have You Gained or Lost A Significant Amount of Weight in the Past Six Months?: No Do You Follow a Special Diet?: No Do You Have Any Trouble Sleeping?: No   CCA Employment/Education Employment/Work Situation: Employment / Work Psychologist, occupational Employment situation: Unemployed Has patient ever been in the [employer]?: No  Education: Education Is Patient Currently Attending School?: No Did You Have An Individualized Education Program (IIEP): No Did You Have Any  Difficulty At [employer]?: No Patient's Education Has Been Impacted by Current Illness: No   CCA Family/Childhood History Family and Relationship History: Family history Marital status:  (Unknown) Are you sexually active?:  (Unknown) Does patient have children?: Yes How many children?: 2  Childhood History:  Childhood History Does patient have siblings?: No Did patient suffer any verbal/emotional/physical/sexual abuse as a child?: No Did patient suffer from severe childhood neglect?: No Has patient ever been sexually abused/assaulted/raped as an adolescent or adult?: No Was the patient ever a victim of a crime or a disaster?: No Witnessed domestic violence?: No Has patient been affected by domestic violence as an adult?: No  Child/Adolescent Assessment:     CCA Substance Use Alcohol/Drug Use: Alcohol / Drug Use Pain Medications: See MAR Prescriptions: See MAR Over the Counter: See MAR History of alcohol / drug use?: Yes Substance #1 Name of Substance 1: Opioids 1 - Age of First Use: 18 1 - Amount (size/oz): 20-40mg  1 - Frequency: daily 1 - Duration: 10 years 1 - Last Use / Amount: 08/14/20 1- Route of Use: Oral                       ASAM's:  Six Dimensions of Multidimensional Assessment  Dimension 1:  Acute Intoxication and/or Withdrawal Potential:      Dimension 2:  Biomedical Conditions and Complications:      Dimension 3:  Emotional, Behavioral, or Cognitive Conditions and Complications:     Dimension 4:  Readiness to Change:     Dimension 5:  Relapse, Continued use, or Continued Problem Potential:     Dimension 6:  Recovery/Living Environment:     ASAM Severity Score:    ASAM Recommended Level of Treatment:     Substance use Disorder (SUD) Substance Use Disorder (SUD)  Checklist Symptoms of Substance Use: Continued use despite having a persistent/recurrent physical/psychological problem caused/exacerbated by use,Continued use despite persistent or  recurrent social, interpersonal problems, caused or exacerbated by use,Evidence of tolerance,Evidence of withdrawal (Comment),Large amounts of time spent to obtain, use or recover from the substance(s),Persistent desire or unsuccessful efforts to cut down or control use,Presence of craving or strong urge to use,Recurrent use that results in a failure to fulfill major role obligations (work, school, home),Repeated use in physically hazardous situations,Social, occupational, recreational activities given up or reduced due to use,Substance(s) often taken in larger amounts or over longer times than was intended  Recommendations for Services/Supports/Treatments: Recommendations for Services/Supports/Treatments Recommendations For Services/Supports/Treatments: Detox,IOP (Intensive Outpatient Program)  Patient is requesting detox treatment and would like referral to be sent to [facility]. Patient was also given information for Outpatient treatment at [facility] if patient did not wish to wait on a bed for detox  DSM5 Diagnoses: There are no problems to display  for this patient.   Patient Centered Plan: Patient is on the following Treatment Plan(s):  Substance Abuse   Referrals to Alternative Service(s): Referred to Alternative Service(s):   Place:   Date:   Time:    Referred to Alternative Service(s):   Place:   Date:   Time:    Referred to Alternative Service(s):   Place:   Date:   Time:    Referred to Alternative Service(s):   Place:   Date:   Time:     Deliana Avalos A Ritter Helsley, LCAS-A

## 2020-08-17 NOTE — ED Notes (Signed)
Pt provided with warm blankets and resting comfortably in hallway bed with bed at lowest level for safety.

## 2020-08-17 NOTE — ED Provider Notes (Signed)
[facility] Emergency Department Provider Note   ____________________________________________   Event Date/Time   First MD Initiated Contact with Patient 08/17/20 [phone]     (approximate)  I have reviewed the triage vital signs and the nursing notes.   HISTORY  Chief Complaint detox    HPI Alicia Mccarthy is a 30 y.o. female who presents to the ED from home seeking detox from opiates.  Patient states she uses approximately 40 mg of OxyContin daily.  Last used 1 to 2 days ago.  Also sometimes uses more prescribed Klonopin as she should, up to 1.5 mg daily.  Denies other substance use or EtOH.  Denies SI/HI/AH/VH.  Medically just complains of some generalized body aches and queasiness.  Denies chest pain, shortness of breath, abdominal pain, vomiting or diarrhea.     Past Medical History:  Diagnosis Date  . Anxiety   . Depression   . Herpes simplex   . Migraines   . PID (acute pelvic inflammatory disease)     There are no problems to display for this patient.   Past Surgical History:  Procedure Laterality Date  . CESAREAN SECTION      Prior to Admission medications   Medication Sig Start Date End Date Taking? Authorizing Provider  cetirizine (ZYRTEC) 10 MG tablet Take 1 tablet (10 mg total) by mouth daily. 10/06/19   Triplett, Cari B, FNP  fluticasone (FLONASE) 50 MCG/ACT nasal spray Place 1 spray into both nostrils 2 (two) times daily. 06/11/17   Cuthriell, Delorise Royals, PA-C  magic mouthwash w/lidocaine SOLN Take 5 mLs by mouth 4 (four) times daily. 06/11/17   Cuthriell, Delorise Royals, PA-C  meloxicam (MOBIC) 15 MG tablet Take 1 tablet (15 mg total) by mouth daily. 06/11/17   Cuthriell, Delorise Royals, PA-C  methocarbamol (ROBAXIN) 500 MG tablet Take 1 tablet (500 mg total) by mouth 4 (four) times daily. 06/11/17   Cuthriell, Delorise Royals, PA-C  predniSONE (STERAPRED UNI-PAK 21 TAB) 10 MG (21) TBPK tablet Take 6 tablets the first day, take 5 tablets the  second day, take 4 tablets the third day, take 3 tablets the fourth day, take 2 tablets the fifth day, take 1 tablet the sixth day. 12/21/18   Darci Current, MD    Allergies Tramadol and Vicodin [hydrocodone-acetaminophen]  History reviewed. No pertinent family history.  Social History Social History   Tobacco Use  . Smoking status: Current Every Day Smoker    Packs/day: 0.50    Types: Cigarettes  . Smokeless tobacco: Never Used  Vaping Use  . Vaping Use: Never used  Substance Use Topics  . Alcohol use: No  . Drug use: No    Review of Systems  Constitutional: Positive for body aches.  No fever/chills Eyes: No visual changes. ENT: No sore throat. Cardiovascular: Denies chest pain. Respiratory: Denies shortness of breath. Gastrointestinal: No abdominal pain.  No nausea, no vomiting.  No diarrhea.  No constipation. Genitourinary: Negative for dysuria. Musculoskeletal: Negative for back pain. Skin: Negative for rash. Neurological: Negative for headaches, focal weakness or numbness. Psychiatric:  Positive for substance abuse.  ____________________________________________   PHYSICAL EXAM:  VITAL SIGNS: ED Triage Vitals  Enc Vitals Group     BP 08/16/20 2021 (!) 137/94     Pulse Rate 08/16/20 2021 75     Resp 08/16/20 2021 19     Temp 08/16/20 2021 97.6 F (36.4 C)     Temp Source 08/16/20 2021 Oral  SpO2 08/16/20 2021 100 %     Weight 08/16/20 2022 105 lb (47.6 kg)     Height 08/16/20 2022 5\' 2"  (1.575 m)     Head Circumference --      Peak Flow --      Pain Score 08/16/20 2022 5     Pain Loc --      Pain Edu? --      Excl. in GC? --     Constitutional: Alert and oriented. Well appearing and in no acute distress. Eyes: Conjunctivae are normal. PERRL. EOMI. Head: Atraumatic. Nose: No congestion/rhinnorhea. Mouth/Throat: Mucous membranes are moist.   Neck: No stridor.   Cardiovascular: Normal rate, regular rhythm. Grossly normal heart sounds.  Good  peripheral circulation. Respiratory: Normal respiratory effort.  No retractions. Lungs CTAB. Gastrointestinal: Soft and nontender. No distention. No abdominal bruits. No CVA tenderness. Musculoskeletal: No lower extremity tenderness nor edema.  No joint effusions. Neurologic:  Normal speech and language. No gross focal neurologic deficits are appreciated. No gait instability. Skin:  Skin is warm, dry and intact. No rash noted. Psychiatric: Mood and affect are normal. Speech and behavior are normal.  ____________________________________________   LABS (all labs ordered are listed, but only abnormal results are displayed)  Labs Reviewed - No data to display ____________________________________________  EKG  None ____________________________________________  RADIOLOGY I, Akeela Busk J, personally viewed and evaluated these images (plain radiographs) as part of my medical decision making, as well as reviewing the written report by the radiologist.  ED MD interpretation: None  Official radiology report(s): No results found.  ____________________________________________   PROCEDURES  Procedure(s) performed (including Critical Care):  Procedures   ____________________________________________   INITIAL IMPRESSION / ASSESSMENT AND PLAN / ED COURSE  As part of my medical decision making, I reviewed the following data within the electronic MEDICAL RECORD NUMBER Nursing notes reviewed and incorporated, Old chart reviewed, A consult was requested and obtained from this/these consultant(s) [facility] and Notes from prior ED visits     30 year old female seeking detox from opiates.  No SI/HI/AH/VH.  [facility] was consulted who gave patient outpatient detox resources.  Clonidine patch applied.  Strict return precautions given.  Patient verbalizes understanding and agrees with plan of care      ____________________________________________   FINAL CLINICAL IMPRESSION(S) / ED DIAGNOSES  Final  diagnoses:  Opiate abuse, continuous [name]     ED Discharge Orders    None      *Please note:  Alicia Mccarthy was evaluated in Emergency Department on 08/17/2020 for the symptoms described in the history of present illness. She was evaluated in the context of the global COVID-19 pandemic, which necessitated consideration that the patient might be at risk for infection with the SARS-CoV-2 virus that causes COVID-19. Institutional protocols and algorithms that pertain to the evaluation of patients at risk for COVID-19 are in a state of rapid change based on information released by regulatory bodies including the CDC and federal and state organizations. These policies and algorithms were followed during the patient's care in the ED.  Some ED evaluations and interventions may be delayed as a result of limited staffing during and the pandemic.*   Note:  This document was prepared using Dragon voice recognition software and may include unintentional dictation errors.   08/19/2020, MD 08/17/20 [phone]

## 2020-08-17 NOTE — Discharge Instructions (Addendum)
You may remove Clonidine patch in 1 week.  This is to help control symptoms of withdrawal.  Return to the ER for worsening symptoms, persistent vomiting, difficulty breathing or other concerns.

## 2022-02-19 ENCOUNTER — Emergency Department
Admission: EM | Admit: 2022-02-19 | Discharge: 2022-02-19 | Disposition: A | Discharge status: Home / Self Care | Payer: Medicaid Other | Attending: Emergency Medicine | Admitting: Emergency Medicine

## 2022-02-19 ENCOUNTER — Emergency Department: Payer: Medicaid Other

## 2022-02-19 ENCOUNTER — Other Ambulatory Visit: Payer: Self-pay

## 2022-02-19 ENCOUNTER — Encounter: Payer: Self-pay | Admitting: Emergency Medicine

## 2022-02-19 DIAGNOSIS — M5412 Radiculopathy, cervical region: Secondary | ICD-10-CM

## 2022-02-19 MED ORDER — BACLOFEN 10 MG PO TABS: 10.0000 mg | ORAL_TABLET | Freq: Three times a day (TID) | ORAL | 0 refills | Status: AC

## 2022-02-19 MED ORDER — PREDNISONE 10 MG (21) PO TBPK: ORAL_TABLET | ORAL | 0 refills | Status: DC

## 2022-02-19 NOTE — ED Provider Notes (Signed)
[facility] Provider Note    Event Date/Time   First MD Initiated Contact with Patient 02/19/22 1638     (approximate)   History   Arm Tingling   HPI  Alicia Mccarthy is a 31 y.o. female with history of migraines, anxiety presents emergency department complaining of left arm tingling.  Patient states that she woke up with a crick in her neck and then started having tingling in the arm all the way to the hand.  No loss of motion.  States she was at work and came to the ED to figure out what is wrong.  No known injury      Physical Exam   Triage Vital Signs: ED Triage Vitals [02/19/22 1401]  Enc Vitals Group     BP 128/88     Pulse Rate (!) 110     Resp 18     Temp 98 F (36.7 C)     Temp Source Oral     SpO2 100 %     Weight      Height      Head Circumference      Peak Flow      Pain Score 0     Pain Loc      Pain Edu?      Excl. in GC?     Most recent vital signs: Vitals:   02/19/22 1401  BP: 128/88  Pulse: (!) 110  Resp: 18  Temp: 98 F (36.7 C)  SpO2: 100%     General: Awake, no distress.   CV:  Good peripheral perfusion. regular rate and  rhythm Resp:  Normal effort.  Abd:  No distention.   Other:  Full range of motion of C-spine, grips are equal bilaterally, pain is reproduced with palpation of the trapezius/supraspinatus muscles.   ED Results / Procedures / Treatments   Labs (all labs ordered are listed, but only abnormal results are displayed) Labs Reviewed - No data to display   EKG      RADIOLOGY X-rays C-spine    PROCEDURES:   Procedures   MEDICATIONS ORDERED IN ED: Medications - No data to display   IMPRESSION / MDM / ASSESSMENT AND PLAN / ED COURSE  I reviewed the triage vital signs and the nursing notes.                              Differential diagnosis includes, but is not limited to, cervical radiculopathy, muscle spasm, CVA  Patient's presentation is most consistent with acute  complicated illness / injury requiring diagnostic workup.   Presentation is more consistent with cervical radiculopathy than CVA.  Feel CVA is less likely due to the patient's neuro status.  X-ray of the C-spine was independently reviewed and interpreted by me as having some spinal narrowing.  I did explain these findings to the patient.  Due to the tenderness of the muscle reproducing pain placed her on a steroid pack and muscle relaxer.  She is to follow-up with orthopedics if not improving in 3 to 4 days.  Return emergency department if worsening.  Apply ice to the area.  She was discharged in stable condition with a work note.      FINAL CLINICAL IMPRESSION(S) / ED DIAGNOSES   Final diagnoses:  Cervical radiculopathy     Rx / DC Orders   ED Discharge Orders  Ordered    predniSONE (STERAPRED UNI-PAK 21 TAB) 10 MG (21) TBPK tablet        02/19/22 1652    baclofen (LIORESAL) 10 MG tablet  3 times daily        02/19/22 1652             Note:  This document was prepared using Dragon voice recognition software and may include unintentional dictation errors.    Faythe Ghee, PA-C 02/19/22 1701    Arnaldo Natal, MD 02/19/22 [phone]

## 2022-02-19 NOTE — Discharge Instructions (Signed)
Follow up with [facility] orthopedics if not improving in 3 to 4 days Apply ice to the neck and left shoulder Take the medication as prescribed

## 2022-02-19 NOTE — ED Triage Notes (Signed)
Pt reports left arm tingling and "feeling like it is going to fall asleep." PT has full ROM and feeling in the extremity. Denies injury to the area. Pt reports left sided neck tightness and stiffness.

## 2022-02-19 NOTE — ED Provider Triage Note (Signed)
  Emergency Medicine Provider Triage Evaluation Note  Alicia Mccarthy , a 31 y.o.female,  was evaluated in triage.  Pt complains of left arm numbness/tingling has been going on since she woke up this morning.  Reportedly extends from her neck down to her fingers.  Denies any recent injuries or illnesses.  Denies any other symptoms.   Review of Systems  Positive: Left arm numbness/tingling Negative: Denies fever, chest pain, vomiting  Physical Exam   Vitals:   02/19/22 1401  BP: 128/88  Pulse: (!) 110  Resp: 18  Temp: 98 F (36.7 C)  SpO2: 100%   Gen:   Awake, no distress   Resp:  Normal effort  MSK:   Moves extremities without difficulty  Other:    Medical Decision Making  Given the patient's initial medical screening exam, the following diagnostic evaluation has been ordered. The patient will be placed in the appropriate treatment space, once one is available, to complete the evaluation and treatment. I have discussed the plan of care with the patient and I have advised the patient that an ED physician or mid-level practitioner will reevaluate their condition after the test results have been received, as the results may give them additional insight into the type of treatment they may need.    Diagnostics: Cervical spine x-ray, EKG  Treatments: none immediately   Varney Daily, [place] 02/19/22 [phone]

## 2022-03-30 ENCOUNTER — Other Ambulatory Visit: Payer: Self-pay

## 2022-03-30 ENCOUNTER — Emergency Department
Admission: EM | Admit: 2022-03-30 | Discharge: 2022-03-30 | Disposition: A | Discharge status: Home / Self Care | Payer: Medicaid Other | Attending: Emergency Medicine | Admitting: Emergency Medicine

## 2022-03-30 DIAGNOSIS — J029 Acute pharyngitis, unspecified: Secondary | ICD-10-CM | POA: Insufficient documentation

## 2022-03-30 DIAGNOSIS — Z20822 Contact with and (suspected) exposure to covid-19: Secondary | ICD-10-CM | POA: Insufficient documentation

## 2022-03-30 LAB — GROUP A STREP BY PCR: Group A Strep by PCR: NOT DETECTED

## 2022-03-30 LAB — RESP PANEL BY RT-PCR (FLU A&B, COVID) ARPGX2
Influenza A by PCR: NEGATIVE
Influenza B by PCR: NEGATIVE
SARS Coronavirus 2 by RT PCR: NEGATIVE

## 2022-03-30 MED ORDER — AMOXICILLIN 875 MG PO TABS: 875.0000 mg | ORAL_TABLET | Freq: Two times a day (BID) | ORAL | 0 refills | Status: DC

## 2022-03-30 MED ORDER — PREDNISONE 10 MG PO TABS: 30.0000 mg | ORAL_TABLET | Freq: Every day | ORAL | 0 refills | Status: DC

## 2022-03-30 NOTE — ED Provider Triage Note (Signed)
Emergency Medicine Provider Triage Evaluation Note  NATARSHA HURWITZ , a 31 y.o. female  was evaluated in triage.  Pt complains of sore throat, swollen lymph nodes x2 days.  Review of Systems  Positive: Sore throat Negative: Fever  Physical Exam  BP (!) 140/96 (BP Location: Right Arm)   Pulse 86   Temp 97.8 F (36.6 C) (Oral)   Resp 18   SpO2 98%  Gen:   Awake, no distress   Resp:  Normal effort  MSK:   Moves extremities without difficulty  Other:  Appears to be red and irritated with mild left-sided lymph node swollen  Medical Decision Making  Medically screening exam initiated at 12:14 PM.  Appropriate orders placed.  Waldon Merl was informed that the remainder of the evaluation will be completed by another provider, this initial triage assessment does not replace that evaluation, and the importance of remaining in the ED until their evaluation is complete.  COVID and strep swab obtained   Versie Starks, PA-C 03/30/22 1216

## 2022-03-30 NOTE — Discharge Instructions (Addendum)
Follow-up with your regular doctor as needed.  Follow-up with ENT if not improving in 1 week.  Return if worsening.  Take medication as prescribed.  Gargle with warm salt water.

## 2022-03-30 NOTE — ED Notes (Signed)
Patient's mother is a patient in main room 14. Patient has been requested to go to her mother's room when she is finished with her visit.

## 2022-03-30 NOTE — ED Triage Notes (Signed)
Pt presents to ED with /co of sore throat for the past 4 days, pt states swollen neck area for about 1 week. Pt denies fevers or chills.

## 2022-03-30 NOTE — ED Provider Notes (Signed)
[facility] Provider Note    Event Date/Time   First MD Initiated Contact with Patient 03/30/22 1407     (approximate)   History   Sore Throat   HPI  Alicia Mccarthy is a 31 y.o. female with history of migraines presents emergency department complaining of a sore throat for the past 4 days.  No fever or chills.  Area around the neck is also been swollen and tender.  Patient states feels like her lymph nodes are swollen.  No dental pain.  No vomiting or diarrhea.      Physical Exam   Triage Vital Signs: ED Triage Vitals  Enc Vitals Group     BP 03/30/22 1207 (!) 140/96     Pulse Rate 03/30/22 1207 86     Resp 03/30/22 1207 18     Temp 03/30/22 1207 97.8 F (36.6 C)     Temp Source 03/30/22 1207 Oral     SpO2 03/30/22 1207 98 %     Weight 03/30/22 1331 104 lb 15 oz (47.6 kg)     Height 03/30/22 1331 5\' 2"  (1.575 m)     Head Circumference --      Peak Flow --      Pain Score 03/30/22 1207 2     Pain Loc --      Pain Edu? --      Excl. in GC? --     Most recent vital signs: Vitals:   03/30/22 1207  BP: (!) 140/96  Pulse: 86  Resp: 18  Temp: 97.8 F (36.6 C)  SpO2: 98%     General: Awake, no distress.   CV:  Good peripheral perfusion. regular rate and  rhythm Resp:  Normal effort. Lungs CTA Abd:  No distention.   Other:  TMs dull bilaterally, neck is supple, cervical lymphadenopathy noted on the left, left tonsil is slightly red and swollen, no pustules noted   ED Results / Procedures / Treatments   Labs (all labs ordered are listed, but only abnormal results are displayed) Labs Reviewed  GROUP A STREP BY PCR  RESP PANEL BY RT-PCR (FLU A&B, COVID) [id number]     EKG     RADIOLOGY     PROCEDURES:   Procedures   MEDICATIONS ORDERED IN ED: Medications - No data to display   IMPRESSION / MDM / ASSESSMENT AND PLAN / ED COURSE  I reviewed the triage vital signs and the nursing notes.                               Differential diagnosis includes, but is not limited to, strep throat, tonsillitis, pharyngitis, abscess  Patient's presentation is most consistent with acute complicated illness / injury requiring diagnostic workup.   I did explain findings to the patient.  Her strep test and COVID test were both negative.  We will treat her with amoxicillin and prednisone 30 mg daily for 3 days.  She is to follow-up with her regular doctor or ENT if not improving 1 week.  Return if worsening.  She is agreement treatment plan.  Discharged stable condition.      FINAL CLINICAL IMPRESSION(S) / ED DIAGNOSES   Final diagnoses:  Acute pharyngitis, unspecified etiology     Rx / DC Orders   ED Discharge Orders          Ordered    amoxicillin (AMOXIL) 875 MG tablet  2  times daily,   Status:  Discontinued        03/30/22 1431    predniSONE (DELTASONE) 10 MG tablet  Daily with breakfast        03/30/22 1431    amoxicillin (AMOXIL) 875 MG tablet  2 times daily        03/30/22 1432             Note:  This document was prepared using Dragon voice recognition software and may include unintentional dictation errors.    Versie Starks, PA-C 03/30/22 1434    Carrie Mew, MD 03/30/22 2003

## 2022-04-26 ENCOUNTER — Telehealth: Payer: Self-pay | Admitting: Nurse Practitioner

## 2022-04-26 DIAGNOSIS — J069 Acute upper respiratory infection, unspecified: Secondary | ICD-10-CM

## 2022-04-26 MED ORDER — AMOXICILLIN-POT CLAVULANATE 875-125 MG PO TABS: 1.0000 | ORAL_TABLET | Freq: Two times a day (BID) | ORAL | 0 refills | Status: DC

## 2022-05-16 ENCOUNTER — Telehealth: Payer: Self-pay | Admitting: Nurse Practitioner

## 2022-05-16 DIAGNOSIS — K047 Periapical abscess without sinus: Secondary | ICD-10-CM

## 2022-05-16 MED ORDER — AMOXICILLIN 500 MG PO CAPS: 500.0000 mg | ORAL_CAPSULE | Freq: Three times a day (TID) | ORAL | 0 refills | Status: AC

## 2022-05-16 NOTE — Progress Notes (Signed)
Virtual Visit Consent   Alicia Mccarthy, you are scheduled for a virtual visit with a [facility] provider today. Just as with appointments in the office, your consent must be obtained to participate. Your consent will be active for this visit and any virtual visit you may have with one of our providers in the next 365 days. If you have a MyChart account, a copy of this consent can be sent to you electronically.  As this is a virtual visit, video technology does not allow for your provider to perform a traditional examination. This may limit your provider's ability to fully assess your condition. If your provider identifies any concerns that need to be evaluated in person or the need to arrange testing (such as labs, EKG, etc.), we will make arrangements to do so. Although advances in technology are sophisticated, we cannot ensure that it will always work on either your end or our end. If the connection with a video visit is poor, the visit may have to be switched to a telephone visit. With either a video or telephone visit, we are not always able to ensure that we have a secure connection.  By engaging in this virtual visit, you consent to the provision of healthcare and authorize for your insurance to be billed (if applicable) for the services provided during this visit. Depending on your insurance coverage, you may receive a charge related to this service.  I need to obtain your verbal consent now. Are you willing to proceed with your visit today? Alicia Mccarthy has provided verbal consent on 05/16/2022 for a virtual visit (video or telephone). Viviano Simas, FNP  Date: 05/16/2022 5:10 PM  Virtual Visit via Video Note   I, Viviano Simas, connected with  Alicia Mccarthy  ([id number], May 12, 1991) on 05/16/22 at  5:15 PM EST by a video-enabled telemedicine application and verified that I am speaking with the correct person using two identifiers.  Location: Patient: Virtual Visit Location  Patient: Home Provider: Virtual Visit Location Provider: Home Office   I discussed the limitations of evaluation and management by telemedicine and the availability of in person appointments. The patient expressed understanding and agreed to proceed.    History of Present Illness: Alicia Mccarthy is a 31 y.o. who identifies as a female who was assigned female at birth, and is being seen today with what she believes is an abscess above her tooth.   She smells an odor and has a bad taste in her mouth. This is on her left upper side and she is starting to feel some tenderness in her left cervical lymph nodes.   Denies fever.    Problems: There are no problems to display for this patient.   Allergies:  Allergies  Allergen Reactions   Tramadol Nausea Only   Vicodin [Hydrocodone-Acetaminophen] Nausea Only   Medications:  Current Outpatient Medications:    amoxicillin (AMOXIL) 875 MG tablet, Take 1 tablet (875 mg total) by mouth 2 (two) times daily., Disp: 20 tablet, Rfl: 0   fluticasone (FLONASE) 50 MCG/ACT nasal spray, Place 1 spray into both nostrils 2 (two) times daily., Disp: 16 g, Rfl: 0   predniSONE (DELTASONE) 10 MG tablet, Take 3 tablets (30 mg total) by mouth daily with breakfast., Disp: 9 tablet, Rfl: 0  Observations/Objective: Patient is well-developed, well-nourished in no acute distress.  Resting comfortably  at home.  Head is normocephalic, atraumatic.  No labored breathing.  Speech is clear and coherent with logical content.  Patient  is alert and oriented at baseline.    Assessment and Plan:  1. Dental abscess  - amoxicillin (AMOXIL) 500 MG capsule; Take 1 capsule (500 mg total) by mouth 3 (three) times daily for 10 days.  Dispense: 30 capsule; Refill: 0  May alternate tylenol and ibuprofen for pain relief   Schedule with dentist in the next week for follow up     Follow Up Instructions: I discussed the assessment and treatment plan with the patient. The  patient was provided an opportunity to ask questions and all were answered. The patient agreed with the plan and demonstrated an understanding of the instructions.  A copy of instructions were sent to the patient via MyChart unless otherwise noted below.    The patient was advised to call back or seek an in-person evaluation if the symptoms worsen or if the condition fails to improve as anticipated.  Time:  I spent 10 minutes with the patient via telehealth technology discussing the above problems/concerns.    Viviano Simas, FNP

## 2022-05-23 ENCOUNTER — Emergency Department
Admission: EM | Admit: 2022-05-23 | Discharge: 2022-05-23 | Disposition: A | Discharge status: Home / Self Care | Payer: Self-pay | Attending: Emergency Medicine | Admitting: Emergency Medicine

## 2022-05-23 ENCOUNTER — Emergency Department: Payer: Self-pay

## 2022-05-23 ENCOUNTER — Encounter: Payer: Self-pay | Admitting: Emergency Medicine

## 2022-05-23 ENCOUNTER — Other Ambulatory Visit: Payer: Self-pay

## 2022-05-23 DIAGNOSIS — M5442 Lumbago with sciatica, left side: Secondary | ICD-10-CM | POA: Insufficient documentation

## 2022-05-23 LAB — POC URINE PREG, ED: Preg Test, Ur: NEGATIVE

## 2022-05-23 MED ORDER — METHOCARBAMOL 500 MG PO TABS: ORAL_TABLET | ORAL | 0 refills | Status: DC

## 2022-05-23 MED ORDER — PREDNISONE 10 MG PO TABS: ORAL_TABLET | ORAL | 0 refills | Status: DC

## 2022-05-23 NOTE — ED Triage Notes (Signed)
Pt sts that she is having trouble walking after she started to have shooting pain go down both of her legs about an hr ago. Pt is tearful in triage.

## 2022-05-23 NOTE — Discharge Instructions (Signed)
Follow-up with your primary care provider if any continued problems or you may make an appointment and follow-up with Dr. Martha Clan who is the orthopedist on-call today.  Moist heat or ice to your back as needed for discomfort.  A prescription for methocarbamol was sent to the pharmacy to take every 6 hours or at bedtime as needed for muscle spasms.  This medication could cause drowsiness and increase your risk for injury.  Do not drive while taking it.  Prednisone is 6 tablets today and tapering down over the next 6 days.  You may take Tylenol with these medications if additional pain medication is needed.

## 2022-05-23 NOTE — ED Provider Notes (Signed)
[facility] Provider Note    Event Date/Time   First MD Initiated Contact with Patient 05/23/22 1349     (approximate)   History   Back Pain   HPI  Alicia Mccarthy is a 31 y.o. female   presents to the ED with complaint of low back pain with pain radiation currently down her left leg but initially was going down both legs.  Patient denies any previous problems with her back.  She denies any urinary symptoms, incontinence of bowel or bladder or saddle anesthesias.  Patient has a history of migraines, PID, depression and anxiety.      Physical Exam   Triage Vital Signs: ED Triage Vitals [05/23/22 1314]  Enc Vitals Group     BP 119/85     Pulse Rate (!) 106     Resp 18     Temp 97.7 F (36.5 C)     Temp Source Oral     SpO2 98 %     Weight 100 lb (45.4 kg)     Height      Head Circumference      Peak Flow      Pain Score 10     Pain Loc      Pain Edu?      Excl. in GC?     Most recent vital signs: Vitals:   05/23/22 1314  BP: 119/85  Pulse: (!) 106  Resp: 18  Temp: 97.7 F (36.5 C)  SpO2: 98%     General: Awake, no distress.  CV:  Good peripheral perfusion.  Resp:  Normal effort.  Abd:  No distention.  Other:  Examination of the lumbar spine there is no gross deformity and no step-offs are appreciated however patient is markedly tender lower lumbar and left SI joint area and soft tissue surrounding that area.  Range of motion is slow and guarded secondary to increased pain.   ED Results / Procedures / Treatments   Labs (all labs ordered are listed, but only abnormal results are displayed) Labs Reviewed  POC URINE PREG, ED     RADIOLOGY Lumbar spine x-ray images were reviewed by myself independent of the radiologist and was negative.    PROCEDURES:  Critical Care performed:   Procedures   MEDICATIONS ORDERED IN ED: Medications - No data to display   IMPRESSION / MDM / ASSESSMENT AND PLAN / ED COURSE  I  reviewed the triage vital signs and the nursing notes.   Differential diagnosis includes, but is not limited to, low back pain, sciatica, left leg radiculopathy, cauda equina.  31 year old female presents to the ED with complaint of low back pain with radiation to her left lower extremity that started today.  Patient has not taken any over-the-counter medication and denies any injury.  Physical exam is consistent with sciatica.  X-rays were obtained of her lumbar spine and were negative which she was reassured.  Patient was discharged with a prescription for prednisone 6-day taper and methocarbamol at bedtime if needed to get comfortable.  She is also aware that she can take it every 6 hours if she is not driving or operating machinery to help relax her muscles.  She is encouraged to use ice or heat to her back and follow-up with her PCP or Dr. Martha Clan if any continued problems or worsening.      Patient's presentation is most consistent with acute complicated illness / injury requiring diagnostic workup.  FINAL CLINICAL IMPRESSION(S) /  ED DIAGNOSES   Final diagnoses:  Acute left-sided low back pain with left-sided sciatica     Rx / DC Orders   ED Discharge Orders          Ordered    methocarbamol (ROBAXIN) 500 MG tablet        05/23/22 1540    predniSONE (DELTASONE) 10 MG tablet        05/23/22 1540             Note:  This document was prepared using Dragon voice recognition software and may include unintentional dictation errors.   Tommi Rumps, PA-C 05/23/22 1551    Merwyn Katos, MD 05/24/22 [phone]

## 2022-08-22 ENCOUNTER — Encounter: Payer: Self-pay | Admitting: Emergency Medicine

## 2022-09-15 ENCOUNTER — Telehealth: Payer: Self-pay | Admitting: Family Medicine

## 2022-09-15 DIAGNOSIS — N3000 Acute cystitis without hematuria: Secondary | ICD-10-CM

## 2022-09-15 MED ORDER — CEPHALEXIN 500 MG PO CAPS: 500.0000 mg | ORAL_CAPSULE | Freq: Two times a day (BID) | ORAL | 0 refills | Status: AC

## 2022-09-15 NOTE — Progress Notes (Signed)

## 2022-12-19 ENCOUNTER — Other Ambulatory Visit: Payer: Self-pay

## 2022-12-19 ENCOUNTER — Emergency Department
Admission: EM | Admit: 2022-12-19 | Discharge: 2022-12-19 | Disposition: A | Discharge status: Home / Self Care | Payer: Medicaid Other | Attending: Emergency Medicine | Admitting: Emergency Medicine

## 2022-12-19 ENCOUNTER — Encounter: Payer: Self-pay | Admitting: *Deleted

## 2022-12-19 DIAGNOSIS — E876 Hypokalemia: Secondary | ICD-10-CM | POA: Insufficient documentation

## 2022-12-19 DIAGNOSIS — F419 Anxiety disorder, unspecified: Secondary | ICD-10-CM | POA: Insufficient documentation

## 2022-12-19 LAB — URINALYSIS, ROUTINE W REFLEX MICROSCOPIC
Bacteria, UA: NONE SEEN
Bilirubin Urine: NEGATIVE
Glucose, UA: NEGATIVE mg/dL
Ketones, ur: 20 mg/dL — AB
Leukocytes,Ua: NEGATIVE
Nitrite: NEGATIVE
Protein, ur: NEGATIVE mg/dL
Specific Gravity, Urine: 1.027 (ref 1.005–1.030)
pH: 5 (ref 5.0–8.0)

## 2022-12-19 LAB — URINE DRUG SCREEN, QUALITATIVE (ARMC ONLY)
Amphetamines, Ur Screen: POSITIVE — AB
Barbiturates, Ur Screen: NOT DETECTED
Benzodiazepine, Ur Scrn: NOT DETECTED
Cannabinoid 50 Ng, Ur ~~LOC~~: NOT DETECTED
Cocaine Metabolite,Ur ~~LOC~~: NOT DETECTED
MDMA (Ecstasy)Ur Screen: NOT DETECTED
Methadone Scn, Ur: NOT DETECTED
Opiate, Ur Screen: NOT DETECTED
Phencyclidine (PCP) Ur S: NOT DETECTED
Tricyclic, Ur Screen: NOT DETECTED

## 2022-12-19 LAB — CBC WITH DIFFERENTIAL/PLATELET
Abs Immature Granulocytes: 0.01 10*3/uL (ref 0.00–0.07)
Basophils Absolute: 0.1 10*3/uL (ref 0.0–0.1)
Basophils Relative: 2 %
Eosinophils Absolute: 0.1 10*3/uL (ref 0.0–0.5)
Eosinophils Relative: 3 %
HCT: 35.7 % — ABNORMAL LOW (ref 36.0–46.0)
Hemoglobin: 11.4 g/dL — ABNORMAL LOW (ref 12.0–15.0)
Immature Granulocytes: 0 %
Lymphocytes Relative: 43 %
Lymphs Abs: 1.7 10*3/uL (ref 0.7–4.0)
MCH: 26.9 pg (ref 26.0–34.0)
MCHC: 31.9 g/dL (ref 30.0–36.0)
MCV: 84.2 fL (ref 80.0–100.0)
Monocytes Absolute: 0.3 10*3/uL (ref 0.1–1.0)
Monocytes Relative: 8 %
Neutro Abs: 1.7 10*3/uL (ref 1.7–7.7)
Neutrophils Relative %: 44 %
Platelets: 259 10*3/uL (ref 150–400)
RBC: 4.24 MIL/uL (ref 3.87–5.11)
RDW: 17.1 % — ABNORMAL HIGH (ref 11.5–15.5)
WBC: 3.9 10*3/uL — ABNORMAL LOW (ref 4.0–10.5)
nRBC: 0 % (ref 0.0–0.2)

## 2022-12-19 LAB — COMPREHENSIVE METABOLIC PANEL
ALT: 12 U/L (ref 0–44)
AST: 18 U/L (ref 15–41)
Albumin: 4.4 g/dL (ref 3.5–5.0)
Alkaline Phosphatase: 49 U/L (ref 38–126)
Anion gap: 13 (ref 5–15)
BUN: 25 mg/dL — ABNORMAL HIGH (ref 6–20)
CO2: 17 mmol/L — ABNORMAL LOW (ref 22–32)
Calcium: 8.5 mg/dL — ABNORMAL LOW (ref 8.9–10.3)
Chloride: 109 mmol/L (ref 98–111)
Creatinine, Ser: 0.74 mg/dL (ref 0.44–1.00)
GFR, Estimated: >60 mL/min (ref >60)
Glucose, Bld: 109 mg/dL — ABNORMAL HIGH (ref 70–99)
Potassium: 2.9 mmol/L — ABNORMAL LOW (ref 3.5–5.1)
Sodium: 139 mmol/L (ref 135–145)
Total Bilirubin: 0.5 mg/dL (ref 0.3–1.2)
Total Protein: 7.1 g/dL (ref 6.5–8.1)

## 2022-12-19 MED ORDER — POTASSIUM CHLORIDE CRYS ER 20 MEQ PO TBCR: 20.0000 meq | EXTENDED_RELEASE_TABLET | Freq: Two times a day (BID) | ORAL | 0 refills | Status: DC

## 2022-12-19 MED ORDER — LORAZEPAM 1 MG PO TABS
0.5000 mg | ORAL_TABLET | Freq: Once | ORAL | Status: AC
  Administered 2022-12-19: 0.5 mg via ORAL
  Filled 2022-12-19: qty 1

## 2022-12-19 MED ORDER — POTASSIUM CHLORIDE CRYS ER 20 MEQ PO TBCR
40.0000 meq | EXTENDED_RELEASE_TABLET | Freq: Once | ORAL | Status: AC
  Administered 2022-12-19: 40 meq via ORAL
  Filled 2022-12-19: qty 2

## 2022-12-19 NOTE — ED Notes (Signed)
See triage note  presents with possible tick bites/ticks to face,abd and legs  No ticks seen on arrival No fever   Pt is very shaky and slightly tearful

## 2022-12-19 NOTE — ED Provider Notes (Signed)
[facility] Emergency Department Provider Note     Event Date/Time   First MD Initiated Contact with Patient 12/19/22 1832     (approximate)   History   Rash   HPI  Alicia Mccarthy is a 32 y.o. female with a history of migraines, depression, anxiety presents to the ED with concern for multiple tick bites.  Patient presents to the ED believing that she has multiple ticks on her body.  She localizes her freckles and moles and is presumed to ticks.  No other infestation of ticks noted by me on evaluation or by the nurse at triage.  Patient reports itching when they "moved."  Patient reports that for the last 2 hours.  She is tearful on presentation denies any illicit drug use at this time.  Patient denies any chest pain, shortness of breath, fevers, chills, or sweats.     Physical Exam   Triage Vital Signs: ED Triage Vitals  Enc Vitals Group     BP 12/19/22 1813 (!) 140/82     Pulse Rate 12/19/22 1813 (!) 111     Resp 12/19/22 1813 18     Temp 12/19/22 1813 98.4 F (36.9 C)     Temp Source 12/19/22 1813 Oral     SpO2 12/19/22 1813 99 %     Weight 12/19/22 1814 100 lb (45.4 kg)     Height 12/19/22 1814 5\' 2"  (1.575 m)     Head Circumference --      Peak Flow --      Pain Score 12/19/22 1814 2     Pain Loc --      Pain Edu? --      Excl. in GC? --     Most recent vital signs: Vitals:   12/19/22 1813 12/19/22 2116  BP: (!) 140/82 (!) 132/90  Pulse: (!) 111 (!) 108  Resp: 18   Temp: 98.4 F (36.9 C)   SpO2: 99% 100%    General Awake, no distress. Anxious, tearful HEENT NCAT. PERRL. EOMI. No rhinorrhea. Mucous membranes are moist.  CV:  Good peripheral perfusion.  RESP:  Normal effort. Tachy rate ABD:  No distention.  SKIN:  Patient with multiple flat hyperpigmented moles and freckles to the upper and lower extremities.  Each 1 is evaluated and found to not be consistent with a tick bite as the patient claims.  No infestations noted on  the patient.   ED Results / Procedures / Treatments   Labs (all labs ordered are listed, but only abnormal results are displayed) Labs Reviewed  URINE DRUG SCREEN, QUALITATIVE ([facility] ONLY) - Abnormal; Notable for the following components:      Result Value   Amphetamines, Ur Screen POSITIVE (*)    All other components within normal limits  URINALYSIS, ROUTINE W REFLEX MICROSCOPIC - Abnormal; Notable for the following components:   Color, Urine YELLOW (*)    APPearance CLEAR (*)    Hgb urine dipstick MODERATE (*)    Ketones, ur 20 (*)    All other components within normal limits  CBC WITH DIFFERENTIAL/PLATELET - Abnormal; Notable for the following components:   WBC 3.9 (*)    Hemoglobin 11.4 (*)    HCT 35.7 (*)    RDW 17.1 (*)    All other components within normal limits  COMPREHENSIVE METABOLIC PANEL - Abnormal; Notable for the following components:   Potassium 2.9 (*)    CO2 17 (*)    Glucose, Bld  109 (*)    BUN 25 (*)    Calcium 8.5 (*)    All other components within normal limits  POC URINE PREG, ED     EKG  Vent. rate 106 BPM PR interval 126 ms QRS duration 92 ms QT/QTcB 346/459 ms P-R-T axes 84 92 17 Sinus tachycardia Rightward axis ST & T wave abnormality,  RADIOLOGY  No results found.   PROCEDURES:  Critical Care performed: No  Procedures   MEDICATIONS ORDERED IN ED: Medications  potassium chloride SA (KLOR-CON M) CR tablet 40 mEq (40 mEq Oral Given 12/19/22 2110)  LORazepam (ATIVAN) tablet 0.5 mg (0.5 mg Oral Given 12/19/22 2119)     IMPRESSION / MDM / ASSESSMENT AND PLAN / ED COURSE  I reviewed the triage vital signs and the nursing notes.                              Differential diagnosis includes, but is not limited to, psychotic break, insect infestation, tick bites, anxiety about a condition, electrolyte abnormality  Patient's presentation is most consistent with acute, uncomplicated illness.  Patient's diagnosis is consistent with  anxiety about a condition not found, hypokalemia.  Patient presented in an excited state about infestation with ticks.  She was found to have reassuring exam overall patient was however, hypokalemic at 2.9 on presentation.  EKG without malignant arrhythmia noted.  Patient treated with p.o. potassium and prescription for the same was provided for her benefit.  Ativan is provided in p.o. dose before discharging patient please stable condition on her own care.  Patient is to follow up with her primary provider as needed or otherwise directed. Patient is given ED precautions to return to the ED for any worsening or new symptoms.   FINAL CLINICAL IMPRESSION(S) / ED DIAGNOSES   Final diagnoses:  Anxiety  Hypokalemia     Rx / DC Orders   ED Discharge Orders          Ordered    potassium chloride SA (KLOR-CON M) 20 MEQ tablet  2 times daily        12/19/22 2112             Note:  This document was prepared using Dragon voice recognition software and may include unintentional dictation errors.    Lissa Hoard, PA-C 12/19/22 2324    Sharyn Creamer, MD 12/20/22 1051

## 2022-12-19 NOTE — Discharge Instructions (Addendum)
Your exam and lab are overall reassuring.  There is no evidence of any infestation with ticks or other insects.  Your labs did reveal a low potassium level (hypokalemia).  This can cause heart palpitations, anxiety, and jitters.  Potassium supplement as directed.  Follow-up with your primary provider or return to the ED if needed.

## 2022-12-19 NOTE — ED Triage Notes (Signed)
Pt has spots on her body that patient believes are ticks.  No ticks seen by this RN.  Pt reports itching when they move.  Sx for 2 hours.  Pt tearful.

## 2023-01-04 ENCOUNTER — Telehealth: Payer: Self-pay | Admitting: Family Medicine

## 2023-01-04 DIAGNOSIS — Z91199 Patient's noncompliance with other medical treatment and regimen due to unspecified reason: Secondary | ICD-10-CM

## 2023-01-04 DIAGNOSIS — B769 Hookworm disease, unspecified: Secondary | ICD-10-CM

## 2023-01-04 MED ORDER — ALBENDAZOLE 200 MG PO TABS: 400.0000 mg | ORAL_TABLET | Freq: Once | ORAL | 0 refills | Status: AC

## 2023-01-04 NOTE — Progress Notes (Signed)
  Virtual Visit Consent   Alicia Mccarthy, no showed for her appointment.

## 2023-01-04 NOTE — Progress Notes (Signed)
Virtual Visit Consent   Alicia Mccarthy, you are scheduled for a virtual visit with a [facility] provider today. Just as with appointments in the office, your consent must be obtained to participate. Your consent will be active for this visit and any virtual visit you may have with one of our providers in the next 365 days. If you have a MyChart account, a copy of this consent can be sent to you electronically.  As this is a virtual visit, video technology does not allow for your provider to perform a traditional examination. This may limit your provider's ability to fully assess your condition. If your provider identifies any concerns that need to be evaluated in person or the need to arrange testing (such as labs, EKG, etc.), we will make arrangements to do so. Although advances in technology are sophisticated, we cannot ensure that it will always work on either your end or our end. If the connection with a video visit is poor, the visit may have to be switched to a telephone visit. With either a video or telephone visit, we are not always able to ensure that we have a secure connection.  By engaging in this virtual visit, you consent to the provision of healthcare and authorize for your insurance to be billed (if applicable) for the services provided during this visit. Depending on your insurance coverage, you may receive a charge related to this service.  I need to obtain your verbal consent now. Are you willing to proceed with your visit today? Alicia Mccarthy has provided verbal consent on 01/04/2023 for a virtual visit (video or telephone). Reed Pandy, [place]  Date: 01/04/2023 5:15 PM  Virtual Visit via Video Note   I, Reed Pandy, connected with  Alicia Mccarthy  ([id number], March 01, 1991) on 01/04/23 at  5:15 PM EDT by a video-enabled telemedicine application and verified that I am speaking with the correct person using two identifiers.  Location: Patient: Virtual Visit Location Patient:  Home Provider: Virtual Visit Location Provider: Home Office   I discussed the limitations of evaluation and management by telemedicine and the availability of in person appointments. The patient expressed understanding and agreed to proceed.    History of Present Illness: Alicia Mccarthy is a 32 y.o. who identifies as a female who was assigned female at birth, and is being seen today for I think I have a bad case  of hook work.  Pt states she see's them in her bowel movement.  Pt states she noticed them just today.  Pt states she is having a little diarrhea, denies nausea or vomiting.  Pt states she had a fever last night.    HPI: HPI  Problems: There are no problems to display for this patient.   Allergies:  Allergies  Allergen Reactions   Tramadol Nausea Only   Vicodin [Hydrocodone-Acetaminophen] Nausea Only   Medications:  Current Outpatient Medications:    albendazole (ALBENZA) 200 MG tablet, Take 2 tablets (400 mg total) by mouth once for 1 dose., Disp: 2 tablet, Rfl: 0   fluticasone (FLONASE) 50 MCG/ACT nasal spray, Place 1 spray into both nostrils 2 (two) times daily., Disp: 16 g, Rfl: 0   methocarbamol (ROBAXIN) 500 MG tablet, 1 tablet q 6 hours or at bedtime as needed for muscles, Disp: 12 tablet, Rfl: 0   potassium chloride SA (KLOR-CON M) 20 MEQ tablet, Take 1 tablet (20 mEq total) by mouth 2 (two) times daily for 3 days., Disp: 6 tablet, Rfl:  0   predniSONE (DELTASONE) 10 MG tablet, Take 6 tablets  today, on day 2 take 5 tablets, day 3 take 4 tablets, day 4 take 3 tablets, day 5 take  2 tablets and 1 tablet the last day, Disp: 21 tablet, Rfl: 0  Observations/Objective: Patient is well-developed, well-nourished in no acute distress.  Resting comfortably at home.  Head is normocephalic, atraumatic.  No labored breathing.  Speech is clear and coherent with logical content.  Patient is alert and oriented at baseline.    Assessment and Plan: 1. Hookworm disease,  unspecified  -Will send once dose of Albendazole  -Advised Pt to make follow up appointment with PCP for further evaluation.  -Pt verbalized understanding.   Follow Up Instructions: I discussed the assessment and treatment plan with the patient. The patient was provided an opportunity to ask questions and all were answered. The patient agreed with the plan and demonstrated an understanding of the instructions.  A copy of instructions were sent to the patient via MyChart unless otherwise noted below.     The patient was advised to call back or seek an in-person evaluation if the symptoms worsen or if the condition fails to improve as anticipated.  Time:  I spent 15 minutes with the patient via telehealth technology discussing the above problems/concerns.    Reed Pandy, PA-C

## 2023-01-04 NOTE — Patient Instructions (Signed)
Alicia Mccarthy, thank you for joining Reed Pandy, PA-C for today's virtual visit.  While this provider is not your primary care provider (PCP), if your PCP is located in our provider database this encounter information will be shared with them immediately following your visit.   A [facility] MyChart account gives you access to today's visit and all your visits, tests, and labs performed at [facility] " click here if you don't have a [facility] MyChart account or go to [email or web address]  Consent: (Patient) Alicia Mccarthy provided verbal consent for this virtual visit at the beginning of the encounter.  Current Medications:  Current Outpatient Medications:    albendazole (ALBENZA) 200 MG tablet, Take 2 tablets (400 mg total) by mouth once for 1 dose., Disp: 2 tablet, Rfl: 0   fluticasone (FLONASE) 50 MCG/ACT nasal spray, Place 1 spray into both nostrils 2 (two) times daily., Disp: 16 g, Rfl: 0   methocarbamol (ROBAXIN) 500 MG tablet, 1 tablet q 6 hours or at bedtime as needed for muscles, Disp: 12 tablet, Rfl: 0   potassium chloride SA (KLOR-CON M) 20 MEQ tablet, Take 1 tablet (20 mEq total) by mouth 2 (two) times daily for 3 days., Disp: 6 tablet, Rfl: 0   predniSONE (DELTASONE) 10 MG tablet, Take 6 tablets  today, on day 2 take 5 tablets, day 3 take 4 tablets, day 4 take 3 tablets, day 5 take  2 tablets and 1 tablet the last day, Disp: 21 tablet, Rfl: 0   Medications ordered in this encounter:  Meds ordered this encounter  Medications   albendazole (ALBENZA) 200 MG tablet    Sig: Take 2 tablets (400 mg total) by mouth once for 1 dose.    Dispense:  2 tablet    Refill:  0     *If you need refills on other medications prior to your next appointment, please contact your pharmacy*  Follow-Up: Call back or seek an in-person evaluation if the symptoms worsen or if the condition fails to improve as anticipated.  [facility] [phone]  Other  Instructions Hookworm Infection Hookworm infection is caused by a type of worm that can live in a person's intestines or lungs. Hookworm infection is not common in the [place]. It is common in areas of the world with poor sanitation. Young hookworms (larvae) can enter the skin and travel to the lungs through the bloodstream. The hookworms can move up the windpipe (trachea) and down into the digestive tract. Adult hookworms can live for a year or more inside the small intestine. If an infection is not diagnosed and treated, it can lead to blood loss from the intestines over time and cause a low level of iron in the blood (iron deficiency anemia). What are the causes? This condition is mainly caused by worms of two species (Ancylostoma duodenale and Necator americanus). An infected person passes hookworm eggs through poop (stool). Poop can get into the soil in areas where there is poor sanitation or where human poop is used as Biochemist, clinical. Hookworm eggs may develop into larvae and stay in the soil. Then, the larvae can pass into another person's skin after that person has contact with soil that contains the larvae (contaminated soil). What increases the risk? You are more likely to get a hookworm infection if: You live or travel in tropical or subtropical areas where these infections are common. These are often areas with poor sanitation. These areas include parts of: [place]. [place].  [place]. You walk barefoot in soil that is contaminated with hookworm larvae. What are the signs or symptoms? The first sign of infection is usually a very itchy rash in the spot on the body where the larvae entered the skin. This is usually on the hands or feet. As the worms pass through the body, other signs and symptoms may develop, including: Coughing, wheezing, sore throat, or fever as the worms infect the lungs. This may happen about a week after infection and may last for a month or longer. Symptoms that  involve the digestive system. These may develop about 30-45 days after infection and may include: Pain in the abdomen. Gas. Nausea. Poor appetite. Diarrhea. If you develop iron deficiency anemia, the symptoms can include: Feeling tired (fatigue) or weakness. Headache or irritability. Pale skin, lips, and nail beds. Poor appetite. Dizziness, shortness of breath, or rapid breathing. Cold hands and feet. Fast or irregular heartbeat. How is this diagnosed? This condition may be diagnosed based on your symptoms and your travel history. A physical exam and tests will be done to confirm the diagnosis. Tests may include: A poop sample. The sample will be checked under a microscope to look for hookworm eggs. A blood test called a complete blood count (CBC). Tests that check for iron deficiency anemia or problems with poor nutrition. How is this treated? This condition may be treated with medicines that kill the worms (anthelmintic medicines). Most infections get better after 1-3 days of treatment. Iron deficiency anemia may be treated with iron supplements and an iron-rich diet. Follow these instructions at home: General instructions Take over-the-counter and prescription medicines only as told by your health care provider. This may include iron supplements. Keep all follow-up visits. Your provider will monitor your need for more treatment. Eating and drinking  If told, eat foods that contain a lot of iron, such as: Liver and organ meats. Low-fat (lean) beef, pork, lamb, shellfish, sardines, and anchovies. Breads, cereals, pasta, and grains that are fortified with iron. Eggs. Dried fruit. Dark green, leafy vegetables. Peas, lima beans, pinto beans, and black-eyed peas. To help your body better absorb the iron that comes from plant or non-meat sources, eat those foods at the same time as fresh fruits and vegetables that are high in vitamin C. Foods that are high in vitamin C include  oranges, peppers, tomatoes, and mango. Drink enough fluid to keep your pee (urine) pale yellow. How is this prevented? To prevent a hookworm infection: Do not touch soil in areas where: Sanitation is poor. Human poop is used for fertilizer. Hookworm infections are common. Do not wear open-toed shoes in areas where hookworm infections are common. Do not walk barefoot in areas where hookworm infections are common. Wash your hands often with soap and water for at least 20 seconds. If soap and water are not available, use hand sanitizer. Contact a health care provider if: You have symptoms that do not get better after treatment. You develop new symptoms of hookworm infection. This information is not intended to replace advice given to you by your health care provider. Make sure you discuss any questions you have with your health care provider. Document Revised: 01/23/2022 Document Reviewed: 01/23/2022 Elsevier Patient Education  2024 Elsevier Inc.   If you have been instructed to have an in-person evaluation today at a local Urgent Care facility, please use the link below. It will take you to a list of all of our available [facility], including address, phone  number and hours of operation. Please do not delay care.  [facility]  If you or a family member do not have a primary care provider, use the link below to schedule a visit and establish care. When you choose a [facility] primary care physician or advanced practice provider, you gain a long-term partner in health. Find a Primary Care Provider  Learn more about [facility] in-office and virtual care options: [facility]

## 2023-05-13 ENCOUNTER — Telehealth: Payer: Self-pay | Admitting: Family Medicine

## 2023-05-13 DIAGNOSIS — J40 Bronchitis, not specified as acute or chronic: Secondary | ICD-10-CM

## 2023-05-13 MED ORDER — AZITHROMYCIN 250 MG PO TABS: ORAL_TABLET | ORAL | 0 refills | Status: AC

## 2023-05-13 MED ORDER — PROMETHAZINE-DM 6.25-15 MG/5ML PO SYRP: 5.0000 mL | ORAL_SOLUTION | Freq: Four times a day (QID) | ORAL | 0 refills | Status: AC | PRN

## 2023-05-13 NOTE — Progress Notes (Addendum)
Virtual Visit Consent   Alicia Mccarthy, you are scheduled for a virtual visit with a [facility] provider today. Just as with appointments in the office, your consent must be obtained to participate. Your consent will be active for this visit and any virtual visit you may have with one of our providers in the next 365 days. If you have a MyChart account, a copy of this consent can be sent to you electronically.  As this is a virtual visit, video technology does not allow for your provider to perform a traditional examination. This may limit your provider's ability to fully assess your condition. If your provider identifies any concerns that need to be evaluated in person or the need to arrange testing (such as labs, EKG, etc.), we will make arrangements to do so. Although advances in technology are sophisticated, we cannot ensure that it will always work on either your end or our end. If the connection with a video visit is poor, the visit may have to be switched to a telephone visit. With either a video or telephone visit, we are not always able to ensure that we have a secure connection.  By engaging in this virtual visit, you consent to the provision of healthcare and authorize for your insurance to be billed (if applicable) for the services provided during this visit. Depending on your insurance coverage, you may receive a charge related to this service.  I need to obtain your verbal consent now. Are you willing to proceed with your visit today? Alicia Mccarthy has provided verbal consent on 05/13/2023 for a virtual visit (video or telephone). Georgana Curio, FNP  Date: 05/13/2023 10:14 AM  Virtual Visit via Video Note   I, Georgana Curio, connected with  Alicia Mccarthy  ([id number], 12-18-1990) on 05/13/23 at 10:15 AM EST by a video-enabled telemedicine application and verified that I am speaking with the correct person using two identifiers.  Location: Patient: Virtual Visit Location  Patient: Home Provider: Virtual Visit Location Provider: Home Office   I discussed the limitations of evaluation and management by telemedicine and the availability of in person appointments. The patient expressed understanding and agreed to proceed.    History of Present Illness: Alicia Mccarthy is a 32 y.o. who identifies as a female who was assigned female at birth, and is being seen today for cough, head congestion, post nasal drainage, no fever, wheezing or sob. No covid testing done. History of pneumonia with bronchoscopy.  HPI: HPI  Problems: There are no problems to display for this patient.   Allergies:  Allergies  Allergen Reactions   Tramadol Nausea Only   Vicodin [Hydrocodone-Acetaminophen] Nausea Only   Medications:  Current Outpatient Medications:    azithromycin (ZITHROMAX) 250 MG tablet, Take 2 tablets on day 1, then 1 tablet daily on days 2 through 5, Disp: 6 tablet, Rfl: 0   promethazine-dextromethorphan (PROMETHAZINE-DM) 6.25-15 MG/5ML syrup, Take 5 mLs by mouth 4 (four) times daily as needed for up to 10 days for cough., Disp: 118 mL, Rfl: 0   fluticasone (FLONASE) 50 MCG/ACT nasal spray, Place 1 spray into both nostrils 2 (two) times daily., Disp: 16 g, Rfl: 0   methocarbamol (ROBAXIN) 500 MG tablet, 1 tablet q 6 hours or at bedtime as needed for muscles, Disp: 12 tablet, Rfl: 0   potassium chloride SA (KLOR-CON M) 20 MEQ tablet, Take 1 tablet (20 mEq total) by mouth 2 (two) times daily for 3 days., Disp: 6 tablet, Rfl: 0  predniSONE (DELTASONE) 10 MG tablet, Take 6 tablets  today, on day 2 take 5 tablets, day 3 take 4 tablets, day 4 take 3 tablets, day 5 take  2 tablets and 1 tablet the last day, Disp: 21 tablet, Rfl: 0  Observations/Objective: Patient is well-developed, well-nourished in no acute distress.  Resting comfortably  at home.  Head is normocephalic, atraumatic.  No labored breathing.  Speech is clear and coherent with logical content.  Patient is  alert and oriented at baseline.    Assessment and Plan: 1. Bronchitis  Increase fluids, humidifier at night, tylenol or ibuprofen as directed, UC if sx persist or worsen.   Follow Up Instructions: I discussed the assessment and treatment plan with the patient. The patient was provided an opportunity to ask questions and all were answered. The patient agreed with the plan and demonstrated an understanding of the instructions.  A copy of instructions were sent to the patient via MyChart unless otherwise noted below.     The patient was advised to call back or seek an in-person evaluation if the symptoms worsen or if the condition fails to improve as anticipated.    Georgana Curio, FNP

## 2023-05-13 NOTE — Patient Instructions (Signed)

## 2023-08-09 ENCOUNTER — Telehealth: Payer: Medicaid Other

## 2023-08-26 ENCOUNTER — Telehealth: Payer: Self-pay | Admitting: Physician Assistant

## 2023-08-26 ENCOUNTER — Telehealth: Payer: Self-pay | Admitting: Family

## 2023-08-26 DIAGNOSIS — B9689 Other specified bacterial agents as the cause of diseases classified elsewhere: Secondary | ICD-10-CM

## 2023-08-26 DIAGNOSIS — J208 Acute bronchitis due to other specified organisms: Secondary | ICD-10-CM

## 2023-08-26 DIAGNOSIS — R6889 Other general symptoms and signs: Secondary | ICD-10-CM

## 2023-08-26 MED ORDER — BENZONATATE 100 MG PO CAPS: 100.0000 mg | ORAL_CAPSULE | Freq: Three times a day (TID) | ORAL | 0 refills | Status: DC | PRN

## 2023-08-26 MED ORDER — PREDNISONE 10 MG (21) PO TBPK: ORAL_TABLET | ORAL | 0 refills | Status: DC

## 2023-08-26 MED ORDER — DOXYCYCLINE HYCLATE 100 MG PO TABS: 100.0000 mg | ORAL_TABLET | Freq: Two times a day (BID) | ORAL | 0 refills | Status: DC

## 2023-08-26 NOTE — Progress Notes (Signed)
E-Visit for Cough  We are sorry that you are not feeling well.  Here is how we plan to help!  Based on your presentation I believe you most likely have A cough due to bacteria.  When patients have a fever and a productive cough with a change in color or increased sputum production, we are concerned about bacterial bronchitis.  If left untreated it can progress to pneumonia.  If your symptoms do not improve with your treatment plan it is important that you contact your provider.   I have prescribed Doxycycline 100 mg twice a day for 7 days     In addition you may use A non-prescription cough medication called Robitussin DAC. Take 2 teaspoons every 8 hours or Delsym: take 2 teaspoons every 12 hours., A non-prescription cough medication called Mucinex DM: take 2 tablets every 12 hours., and A prescription cough medication called Tessalon Perles 100mg . You may take 1-2 capsules every 8 hours as needed for your cough.  Prednisone 10 mg daily for 6 days (see taper instructions below)  Directions for 6 day taper: Day 1: 2 tablets before breakfast, 1 after both lunch & dinner and 2 at bedtime Day 2: 1 tab before breakfast, 1 after both lunch & dinner and 2 at bedtime Day 3: 1 tab at each meal & 1 at bedtime Day 4: 1 tab at breakfast, 1 at lunch, 1 at bedtime Day 5: 1 tab at breakfast & 1 tab at bedtime Day 6: 1 tab at breakfast  From your responses in the eVisit questionnaire you describe inflammation in the upper respiratory tract which is causing a significant cough.  This is commonly called Bronchitis and has four common causes:   Allergies Viral Infections Acid Reflux Bacterial Infection Allergies, viruses and acid reflux are treated by controlling symptoms or eliminating the cause. An example might be a cough caused by taking certain blood pressure medications. You stop the cough by changing the medication. Another example might be a cough caused by acid reflux. Controlling the reflux helps  control the cough.  USE OF BRONCHODILATOR ("RESCUE") INHALERS: There is a risk from using your bronchodilator too frequently.  The risk is that over-reliance on a medication which only relaxes the muscles surrounding the breathing tubes can reduce the effectiveness of medications prescribed to reduce swelling and congestion of the tubes themselves.  Although you feel brief relief from the bronchodilator inhaler, your asthma may actually be worsening with the tubes becoming more swollen and filled with mucus.  This can delay other crucial treatments, such as oral steroid medications. If you need to use a bronchodilator inhaler daily, several times per day, you should discuss this with your provider.  There are probably better treatments that could be used to keep your asthma under control.     HOME CARE Only take medications as instructed by your medical team. Complete the entire course of an antibiotic. Drink plenty of fluids and get plenty of rest. Avoid close contacts especially the very young and the elderly Cover your mouth if you cough or cough into your sleeve. Always remember to wash your hands A steam or ultrasonic humidifier can help congestion.   GET HELP RIGHT AWAY IF: You develop worsening fever. You become short of breath You cough up blood. Your symptoms persist after you have completed your treatment plan MAKE SURE YOU  Understand these instructions. Will watch your condition. Will get help right away if you are not doing well or get worse.  Thank you for choosing an e-visit.  Your e-visit answers were reviewed by a board certified advanced clinical practitioner to complete your personal care plan. Depending upon the condition, your plan could have included both over the counter or prescription medications.  Please review your pharmacy choice. Make sure the pharmacy is open so you can pick up prescription now. If there is a problem, you may contact your provider through  [employer] and have the prescription routed to another pharmacy.  Your safety is important to [place]. If you have drug allergies check your prescription carefully.   For the next 24 hours you can use MyChart to ask questions about today's visit, request a non-urgent call back, or ask for a work or school excuse. You will get an email in the next two days asking about your experience. I hope that your e-visit has been valuable and will speed your recovery.  Approximately 5 minutes was spent documenting and reviewing patient's chart.

## 2023-08-26 NOTE — Progress Notes (Signed)

## 2023-10-12 ENCOUNTER — Telehealth: Payer: Self-pay | Admitting: Physician Assistant

## 2023-10-12 DIAGNOSIS — L03019 Cellulitis of unspecified finger: Secondary | ICD-10-CM

## 2023-10-12 MED ORDER — CEPHALEXIN 500 MG PO CAPS: 500.0000 mg | ORAL_CAPSULE | Freq: Four times a day (QID) | ORAL | 0 refills | Status: DC

## 2023-10-12 NOTE — Progress Notes (Signed)
 E-Visit for Simple Cut/Laceration  We are sorry that you have had an injury. Here is how we plan to help!  Based on what you shared with me it looks like you have a simple laceration that does not need to be repaired with stitches or tissue glue. There is some potential concern for an infection under the skin. I have prescribed Keflex 500mg  Take 1 capsule 4 times daily for 5 days.  HOME CARE: Clean the cut or scrape - Wash it well with soap and water. * avoid using hydrogen peroxide which may cause tissue damage, or impede wound healing.  Stop the bleeding - If your cut or scrape is bleeding, press a clean cloth or bandage firmly on the area for 20 minutes. You can also help slow the bleeding by holding the cut above the level of your heart.   Put a thin layer of Bacitracin antibiotic ointment on the cut or scrape. (this can be purchased at any local pharmacy- ask your pharmacist if you need assistance)   Cover the cut or scrape with a bandage or gauze. Keep the bandage clean and dry. Change the bandage 1 to 2 times every day until your cut or scrape heals.   Watch for signs that your cut or scrape is infected (redness, drainage, pain, warmth, swelling or fever)  Over the next 48 hours your wound should start to improve with less pain, less swelling and less redness. If you should develop increasing pain, swelling, redness, fever, pus from the wound you should be seen immediately to make sure this is not becoming infected.   WOUND CARE: Please keep a layer of antibiotic ointment (bacitracin preferred) on this wound at least twice a day for the next seven days and keep a sterile dressing over top of it. You may gently clean the wound with warm soap and water between dressing changes.  We strongly recommend that you have a medical provider reevaluate your wound within 2 to 3 days in person to make sure that it is healing appropriately.  Thank you for choosing an e-visit.  Your e-visit answers  were reviewed by a board certified advanced clinical practitioner to complete your personal care plan. Depending upon the condition, your plan could have included both over the counter or prescription medications.  Please review your pharmacy choice. Make sure the pharmacy is open so you can pick up prescription now. If there is a problem, you may contact your provider through [employer] and have the prescription routed to another pharmacy.  Your safety is important to [place]. If you have drug allergies check your prescription carefully.   For the next 24 hours you can use MyChart to ask questions about today's visit, request a non-urgent call back, or ask for a work or school excuse. You will get an email in the next two days asking about your experience. I hope that your e-visit has been valuable and will speed your recovery.   I have spent 5 minutes in review of e-visit questionnaire, review and updating patient chart, medical decision making and response to patient.   Margaretann Loveless, PA-C

## 2023-12-14 ENCOUNTER — Encounter: Payer: Self-pay | Admitting: Emergency Medicine

## 2023-12-14 ENCOUNTER — Emergency Department
Admission: EM | Admit: 2023-12-14 | Discharge: 2023-12-14 | Disposition: A | Discharge status: Home / Self Care | Payer: Self-pay | Attending: Emergency Medicine | Admitting: Emergency Medicine

## 2023-12-14 ENCOUNTER — Other Ambulatory Visit: Payer: Self-pay

## 2023-12-14 DIAGNOSIS — R339 Retention of urine, unspecified: Secondary | ICD-10-CM | POA: Insufficient documentation

## 2023-12-14 DIAGNOSIS — Z0000 Encounter for general adult medical examination without abnormal findings: Secondary | ICD-10-CM

## 2023-12-14 DIAGNOSIS — K5900 Constipation, unspecified: Secondary | ICD-10-CM | POA: Insufficient documentation

## 2023-12-14 LAB — BASIC METABOLIC PANEL WITH GFR
Anion gap: 12 (ref 5–15)
BUN: 12 mg/dL (ref 6–20)
CO2: 21 mmol/L — ABNORMAL LOW (ref 22–32)
Calcium: 9.3 mg/dL (ref 8.9–10.3)
Chloride: 105 mmol/L (ref 98–111)
Creatinine, Ser: 0.61 mg/dL (ref 0.44–1.00)
GFR, Estimated: >60 mL/min (ref >60)
Glucose, Bld: 91 mg/dL (ref 70–99)
Potassium: 3.4 mmol/L — ABNORMAL LOW (ref 3.5–5.1)
Sodium: 138 mmol/L (ref 135–145)

## 2023-12-14 LAB — CBC
HCT: 36.7 % (ref 36.0–46.0)
Hemoglobin: 11.4 g/dL — ABNORMAL LOW (ref 12.0–15.0)
MCH: 25.9 pg — ABNORMAL LOW (ref 26.0–34.0)
MCHC: 31.1 g/dL (ref 30.0–36.0)
MCV: 83.4 fL (ref 80.0–100.0)
Platelets: 262 10*3/uL (ref 150–400)
RBC: 4.4 MIL/uL (ref 3.87–5.11)
RDW: 17.7 % — ABNORMAL HIGH (ref 11.5–15.5)
WBC: 4.4 10*3/uL (ref 4.0–10.5)
nRBC: 0 % (ref 0.0–0.2)

## 2023-12-14 LAB — URINALYSIS, ROUTINE W REFLEX MICROSCOPIC
Bilirubin Urine: NEGATIVE
Glucose, UA: NEGATIVE mg/dL
Ketones, ur: 5 mg/dL — AB
Leukocytes,Ua: NEGATIVE
Nitrite: NEGATIVE
Protein, ur: 30 mg/dL — AB
RBC / HPF: >50 RBC/hpf (ref 0–5)
Specific Gravity, Urine: 1.026 (ref 1.005–1.030)
pH: 5 (ref 5.0–8.0)

## 2023-12-14 LAB — POC URINE PREG, ED: Preg Test, Ur: NEGATIVE

## 2023-12-14 NOTE — ED Notes (Addendum)
 Dr Azalee Bolds chaperoned by this RN during pts rectal exam

## 2023-12-14 NOTE — ED Provider Notes (Signed)
 [facility] Provider Note    Event Date/Time   First MD Initiated Contact with Patient 12/14/23 1954     (approximate)  History   Chief Complaint: Urinary Retention and Constipation  HPI  Tera L Lich is a 33 y.o. female with a past medical history of anxiety, depression, presents to the emergency department with concerns that she could have intestinal worms.  According to the patient for the past few days she has noted some abdominal swelling and some trouble urinating at times.  Patient states when she had a bowel movement she noted what appeared to be worms around her buttocks.  Patient states she also thinks she saw worms in her feet crawling under her skin.  Patient denies any history of parasites previously.  No travel outside of the country.  No fever.  Physical Exam   Triage Vital Signs: ED Triage Vitals [12/14/23 1908]  Encounter Vitals Group     BP (!) 132/93     Girls Systolic BP Percentile      Girls Diastolic BP Percentile      Boys Systolic BP Percentile      Boys Diastolic BP Percentile      Pulse Rate (!) 104     Resp 16     Temp 98.9 F (37.2 C)     Temp Source Oral     SpO2 100 %     Weight 100 lb (45.4 kg)     Height 5' 2 (1.575 m)     Head Circumference      Peak Flow      Pain Score 5     Pain Loc      Pain Education      Exclude from Growth Chart     Most recent vital signs: Vitals:   12/14/23 1908  BP: (!) 132/93  Pulse: (!) 104  Resp: 16  Temp: 98.9 F (37.2 C)  SpO2: 100%    General: Awake, no distress.  CV:  Good peripheral perfusion.  Regular rate and rhythm  Resp:  Normal effort.  Equal breath sounds bilaterally.  Abd:  No distention.  Soft, nontender.  No rebound or guarding. Other:  External rectal examination performed with the nurse in the room.  There is no sign of any abnormality no hemorrhoids no worms no redness.   ED Results / Procedures / Treatments    MEDICATIONS ORDERED IN  ED: Medications - No data to display   IMPRESSION / MDM / ASSESSMENT AND PLAN / ED COURSE  I reviewed the triage vital signs and the nursing notes.  Patient's presentation is most consistent with acute illness / injury with system symptoms.  Patient presents emergency department with acute concern for possible GI parasite.  Patient is describing some sort of warm that she believes is coming from her rectum and traveling around her groin area.  She believes that they could have entered through her foot and at times she thought that she could see something moving around her skin.  Patient states she was concerned so she came to the emergency department for evaluation.  Patient has a soft and benign abdomen she does states several days of constipation states at times she feels like there are worms around her buttocks.  External examination performed with the nurse in the room there is no sign of any parasitic infection there is no erythema or lesions.  Patient's lab work is reassuring with a normal CBC chemistry and reassuring urinalysis  negative pregnancy test.  I discussed with the patient at length tried to reassure her regarding the normal workup and extremely low chance that she would have any type of parasitic infection.  I offered to perform an ova and parasite test on the stool if she was able to provide a stool sample.  Patient tried multiple times but was unable to provide a stool sample in the emergency department.    Patient has apparently eloped from the emergency department.  FINAL CLINICAL IMPRESSION(S) / ED DIAGNOSES   Medical evaluation    Note:  This document was prepared using Dragon voice recognition software and may include unintentional dictation errors.   Ruth Cove, MD 12/14/23 2142

## 2023-12-14 NOTE — ED Triage Notes (Addendum)
 Patient reports urinary retention for the past two days. Patient reports having the urge to urinate but only able to produce small amount. Patient also reports struggling to have bowel movement - last BM 2 days ago. Patient also concerned because she has intermittently seen worm like figures in bilateral lower legs and feet.

## 2023-12-14 NOTE — ED Notes (Addendum)
 Pt seen walking out of room with belongings and family members. ER Doc notified. Pt left before she was put up for discharge.

## 2024-04-11 ENCOUNTER — Telehealth: Admitting: Physician Assistant

## 2024-04-11 DIAGNOSIS — B9689 Other specified bacterial agents as the cause of diseases classified elsewhere: Secondary | ICD-10-CM

## 2024-04-11 DIAGNOSIS — J069 Acute upper respiratory infection, unspecified: Secondary | ICD-10-CM

## 2024-04-11 MED ORDER — FLUTICASONE PROPIONATE 50 MCG/ACT NA SUSP: 2.0000 | Freq: Every day | NASAL | 0 refills | Status: AC

## 2024-04-11 MED ORDER — PREDNISONE 10 MG (21) PO TBPK: ORAL_TABLET | ORAL | 0 refills | Status: AC

## 2024-04-11 MED ORDER — AMOXICILLIN-POT CLAVULANATE 875-125 MG PO TABS: 1.0000 | ORAL_TABLET | Freq: Two times a day (BID) | ORAL | 0 refills | Status: AC

## 2024-04-11 MED ORDER — PROMETHAZINE-DM 6.25-15 MG/5ML PO SYRP: 5.0000 mL | ORAL_SOLUTION | Freq: Four times a day (QID) | ORAL | 0 refills | Status: AC | PRN

## 2024-04-11 NOTE — Patient Instructions (Signed)
 Alicia Mccarthy, thank you for joining Delon CHRISTELLA Dickinson, PA-C for today's virtual visit.  While this provider is not your primary care provider (PCP), if your PCP is located in our provider database this encounter information will be shared with them immediately following your visit.   A [facility] MyChart account gives you access to today's visit and all your visits, tests, and labs performed at [facility]  click here if you don't have a [facility] MyChart account or go to [email or web address]  Consent: (Patient) Alicia Mccarthy provided verbal consent for this virtual visit at the beginning of the encounter.  Current Medications:  Current Outpatient Medications:    amoxicillin -clavulanate (AUGMENTIN ) 875-125 MG tablet, Take 1 tablet by mouth 2 (two) times daily., Disp: 14 tablet, Rfl: 0   fluticasone  (FLONASE ) 50 MCG/ACT nasal spray, Place 2 sprays into both nostrils daily., Disp: 16 g, Rfl: 0   predniSONE  (STERAPRED UNI-PAK 21 TAB) 10 MG (21) TBPK tablet, 6 day taper; take as directed on package instructions, Disp: 21 tablet, Rfl: 0   promethazine -dextromethorphan (PROMETHAZINE -DM) 6.25-15 MG/5ML syrup, Take 5 mLs by mouth 4 (four) times daily as needed., Disp: 118 mL, Rfl: 0   Medications ordered in this encounter:  Meds ordered this encounter  Medications   amoxicillin -clavulanate (AUGMENTIN ) 875-125 MG tablet    Sig: Take 1 tablet by mouth 2 (two) times daily.    Dispense:  14 tablet    Refill:  0    Supervising Provider:   LAMPTEY, PHILIP O [[id number]]   promethazine -dextromethorphan (PROMETHAZINE -DM) 6.25-15 MG/5ML syrup    Sig: Take 5 mLs by mouth 4 (four) times daily as needed.    Dispense:  118 mL    Refill:  0    Supervising Provider:   LAMPTEY, PHILIP O [id number]   fluticasone  (FLONASE ) 50 MCG/ACT nasal spray    Sig: Place 2 sprays into both nostrils daily.    Dispense:  16 g    Refill:  0    Supervising Provider:   BLAISE ALEENE KIDD [[id number]]    predniSONE  (STERAPRED UNI-PAK 21 TAB) 10 MG (21) TBPK tablet    Sig: 6 day taper; take as directed on package instructions    Dispense:  21 tablet    Refill:  0    Supervising Provider:   BLAISE ALEENE KIDD [[id number]]     *If you need refills on other medications prior to your next appointment, please contact your pharmacy*  Follow-Up: Call back or seek an in-person evaluation if the symptoms worsen or if the condition fails to improve as anticipated.   Virtual Care [phone]  Other Instructions Upper Respiratory Infection, Adult An upper respiratory infection (URI) is a common viral infection of the nose, throat, and upper air passages that lead to the lungs. The most common type of URI is the common cold. URIs usually get better on their own, without medical treatment. What are the causes? A URI is caused by a virus. You may catch a virus by: Breathing in droplets from an infected person's cough or sneeze. Touching something that has been exposed to the virus (is contaminated) and then touching your mouth, nose, or eyes. What increases the risk? You are more likely to get a URI if: You are very young or very old. You have close contact with others, such as at work, school, or a health care facility. You smoke. You have long-term (chronic) heart or lung disease. You have a weakened disease-fighting system (immune system).  You have nasal allergies or asthma. You are experiencing a lot of stress. You have poor nutrition. What are the signs or symptoms? A URI usually involves some of the following symptoms: Runny or stuffy (congested) nose. Cough. Sneezing. Sore throat. Headache. Fatigue. Fever. Loss of appetite. Pain in your forehead, behind your eyes, and over your cheekbones (sinus pain). Muscle aches. Redness or irritation of the eyes. Pressure in the ears or face. How is this diagnosed? This condition may be diagnosed based on your medical history and  symptoms, and a physical exam. Your health care provider may use a swab to take a mucus sample from your nose (nasal swab). This sample can be tested to determine what virus is causing the illness. How is this treated? URIs usually get better on their own within 7-10 days. Medicines cannot cure URIs, but your health care provider may recommend certain medicines to help relieve symptoms, such as: Over-the-counter cold medicines. Cough suppressants. Coughing is a type of defense against infection that helps to clear the respiratory system, so take these medicines only as recommended by your health care provider. Fever-reducing medicines. Follow these instructions at home: Activity Rest as needed. If you have a fever, stay home from work or school until your fever is gone or until your health care provider says your URI cannot spread to other people (is no longer contagious). Your health care provider may have you wear a face mask to prevent your infection from spreading. Relieving symptoms Gargle with a mixture of salt and water 3-4 times a day or as needed. To make salt water, completely dissolve -1 tsp (3-6 g) of salt in 1 cup (237 mL) of warm water. Use a cool-mist humidifier to add moisture to the air. This can help you breathe more easily. Eating and drinking  Drink enough fluid to keep your urine pale yellow. Eat soups and other clear broths. General instructions  Take over-the-counter and prescription medicines only as told by your health care provider. These include cold medicines, fever reducers, and cough suppressants. Do not use any products that contain nicotine or tobacco. These products include cigarettes, chewing tobacco, and vaping devices, such as e-cigarettes. If you need help quitting, ask your health care provider. Stay away from secondhand smoke. Stay up to date on all immunizations, including the yearly (annual) flu vaccine. Keep all follow-up visits. This is  important. How to prevent the spread of infection to others URIs can be contagious. To prevent the infection from spreading: Wash your hands with soap and water for at least 20 seconds. If soap and water are not available, use hand sanitizer. Avoid touching your mouth, face, eyes, or nose. Cough or sneeze into a tissue or your sleeve or elbow instead of into your hand or into the air.  Contact a health care provider if: You are getting worse instead of better. You have a fever or chills. Your mucus is brown or red. You have yellow or brown discharge coming from your nose. You have pain in your face, especially when you bend forward. You have swollen neck glands. You have pain while swallowing. You have white areas in the back of your throat. Get help right away if: You have shortness of breath that gets worse. You have severe or persistent: Headache. Ear pain. Sinus pain. Chest pain. You have chronic lung disease along with any of the following: Making high-pitched whistling sounds when you breathe, most often when you breathe out (wheezing). Prolonged cough (more than 14  days). Coughing up blood. A change in your usual mucus. You have a stiff neck. You have changes in your: Vision. Hearing. Thinking. Mood. These symptoms may be an emergency. Get help right away. Call 911. Do not wait to see if the symptoms will go away. Do not drive yourself to the hospital. Summary An upper respiratory infection (URI) is a common infection of the nose, throat, and upper air passages that lead to the lungs. A URI is caused by a virus. URIs usually get better on their own within 7-10 days. Medicines cannot cure URIs, but your health care provider may recommend certain medicines to help relieve symptoms. This information is not intended to replace advice given to you by your health care provider. Make sure you discuss any questions you have with your health care provider. Document Revised:  01/19/2021 Document Reviewed: 01/19/2021 Elsevier Patient Education  2024 Elsevier Inc.   If you have been instructed to have an in-person evaluation today at a local Urgent Care facility, please use the link below. It will take you to a list of all of our available [facility], including address, phone number and hours of operation. Please do not delay care.  [facility]  If you or a family member do not have a primary care provider, use the link below to schedule a visit and establish care. When you choose a [facility] primary care physician or advanced practice provider, you gain a long-term partner in health. Find a Primary Care Provider  Learn more about [facility]'s in-office and virtual care options: [facility] - Get Care Now

## 2024-04-11 NOTE — Progress Notes (Signed)
 Virtual Visit Consent   Alicia Mccarthy, you are scheduled for a virtual visit with a [facility] provider today. Just as with appointments in the office, your consent must be obtained to participate. Your consent will be active for this visit and any virtual visit you may have with one of our providers in the next 365 days. If you have a MyChart account, a copy of this consent can be sent to you electronically.  As this is a virtual visit, video technology does not allow for your provider to perform a traditional examination. This may limit your provider's ability to fully assess your condition. If your provider identifies any concerns that need to be evaluated in person or the need to arrange testing (such as labs, EKG, etc.), we will make arrangements to do so. Although advances in technology are sophisticated, we cannot ensure that it will always work on either your end or our end. If the connection with a video visit is poor, the visit may have to be switched to a telephone visit. With either a video or telephone visit, we are not always able to ensure that we have a secure connection.  By engaging in this virtual visit, you consent to the provision of healthcare and authorize for your insurance to be billed (if applicable) for the services provided during this visit. Depending on your insurance coverage, you may receive a charge related to this service.  I need to obtain your verbal consent now. Are you willing to proceed with your visit today? Alicia Mccarthy has provided verbal consent on 04/11/2024 for a virtual visit (video or telephone). Delon CHRISTELLA Dickinson, PA-C  Date: 04/11/2024 10:58 AM   Virtual Visit via Video Note   I, Delon CHRISTELLA Dickinson, connected with  Alicia Mccarthy  ([id number], August 11, 1990) on 04/11/24 at 10:45 AM EDT by a video-enabled telemedicine application and verified that I am speaking with the correct person using two identifiers.  Location: Patient: Virtual  Visit Location Patient: Home Provider: Virtual Visit Location Provider: Home Office   I discussed the limitations of evaluation and management by telemedicine and the availability of in person appointments. The patient expressed understanding and agreed to proceed.    History of Present Illness: Alicia Mccarthy is a 33 y.o. who identifies as a female who was assigned female at birth, and is being seen today for URI symptoms.  HPI: URI  This is a new problem. The current episode started 1 to 4 weeks ago (over 2 weeks). The problem has been gradually worsening. Maximum temperature: waxing and waning low grade subjective. Associated symptoms include chest pain (from coughing), congestion, coughing, headaches, nausea (from mucus drainage), a plugged ear sensation, rhinorrhea, sinus pain, a sore throat (intermittent), swollen glands, vomiting (from drainage) and wheezing (intermittent). Pertinent negatives include no diarrhea or ear pain. Associated symptoms comments: Hot and cold flashes. Treatments tried: Mucinex, robitussin. The treatment provided no relief.   At home covid test was negative.  Problems: There are no active problems to display for this patient.   Allergies:  Allergies  Allergen Reactions   Tramadol Nausea Only   Vicodin [Hydrocodone-Acetaminophen] Nausea Only   Medications:  Current Outpatient Medications:    amoxicillin -clavulanate (AUGMENTIN ) 875-125 MG tablet, Take 1 tablet by mouth 2 (two) times daily., Disp: 14 tablet, Rfl: 0   fluticasone  (FLONASE ) 50 MCG/ACT nasal spray, Place 2 sprays into both nostrils daily., Disp: 16 g, Rfl: 0   predniSONE  (STERAPRED UNI-PAK 21 TAB) 10 MG (21)  TBPK tablet, 6 day taper; take as directed on package instructions, Disp: 21 tablet, Rfl: 0   promethazine -dextromethorphan (PROMETHAZINE -DM) 6.25-15 MG/5ML syrup, Take 5 mLs by mouth 4 (four) times daily as needed., Disp: 118 mL, Rfl: 0  Observations/Objective: Patient is well-developed,  well-nourished in no acute distress.  Resting comfortably at home.  Head is normocephalic, atraumatic.  No labored breathing.  Speech is clear and coherent with logical content.  Patient is alert and oriented at baseline.    Assessment and Plan: 1. Bacterial upper respiratory infection (Primary) - amoxicillin -clavulanate (AUGMENTIN ) 875-125 MG tablet; Take 1 tablet by mouth 2 (two) times daily.  Dispense: 14 tablet; Refill: 0 - promethazine -dextromethorphan (PROMETHAZINE -DM) 6.25-15 MG/5ML syrup; Take 5 mLs by mouth 4 (four) times daily as needed.  Dispense: 118 mL; Refill: 0 - fluticasone  (FLONASE ) 50 MCG/ACT nasal spray; Place 2 sprays into both nostrils daily.  Dispense: 16 g; Refill: 0 - predniSONE  (STERAPRED UNI-PAK 21 TAB) 10 MG (21) TBPK tablet; 6 day taper; take as directed on package instructions  Dispense: 21 tablet; Refill: 0  - Worsening over a week despite OTC medications - Will treat with Augmentin , Prednisone , Promethazine  DM (drowsiness precautions discussed), and Flonase  - Can continue Mucinex (PLAIN) during the day - Push fluids.  - Rest.  - Steam and humidifier can help - Seek in person evaluation if worsening or symptoms fail to improve    Follow Up Instructions: I discussed the assessment and treatment plan with the patient. The patient was provided an opportunity to ask questions and all were answered. The patient agreed with the plan and demonstrated an understanding of the instructions.  A copy of instructions were sent to the patient via MyChart unless otherwise noted below.    The patient was advised to call back or seek an in-person evaluation if the symptoms worsen or if the condition fails to improve as anticipated.    Delon CHRISTELLA Dickinson, PA-C
